# Patient Record
Sex: Female | Born: 1985
Health system: Southern US, Community
[De-identification: ages and names within clinical notes are randomized; demographics above are authoritative.]

## PROBLEM LIST (undated history)

## (undated) DIAGNOSIS — M5126 Other intervertebral disc displacement, lumbar region: Secondary | ICD-10-CM

## (undated) HISTORY — DX: Other intervertebral disc displacement, lumbar region: M51.26

## (undated) HISTORY — PX: OTHER SURGICAL HISTORY: SHX169

---

## 2011-06-27 ENCOUNTER — Encounter: Payer: Self-pay | Admitting: Family Medicine

## 2011-06-27 ENCOUNTER — Ambulatory Visit (INDEPENDENT_AMBULATORY_CARE_PROVIDER_SITE_OTHER): Payer: 59 | Admitting: Family Medicine

## 2011-06-27 DIAGNOSIS — L600 Ingrowing nail: Secondary | ICD-10-CM | POA: Insufficient documentation

## 2011-06-27 DIAGNOSIS — S93409A Sprain of unspecified ligament of unspecified ankle, initial encounter: Secondary | ICD-10-CM

## 2011-06-27 DIAGNOSIS — Z309 Encounter for contraceptive management, unspecified: Secondary | ICD-10-CM

## 2011-06-27 DIAGNOSIS — L709 Acne, unspecified: Secondary | ICD-10-CM | POA: Insufficient documentation

## 2011-06-27 DIAGNOSIS — IMO0001 Reserved for inherently not codable concepts without codable children: Secondary | ICD-10-CM | POA: Insufficient documentation

## 2011-06-27 DIAGNOSIS — L708 Other acne: Secondary | ICD-10-CM

## 2011-06-27 DIAGNOSIS — S93402A Sprain of unspecified ligament of left ankle, initial encounter: Secondary | ICD-10-CM

## 2011-06-27 MED ORDER — NORGESTIMATE-ETH ESTRADIOL 0.25-35 MG-MCG PO TABS: 1.0000 | ORAL_TABLET | Freq: Every day | ORAL | Status: DC

## 2011-06-27 MED ORDER — BENZOYL PEROXIDE 5 % EX GEL: Freq: Every day | CUTANEOUS | Status: DC

## 2011-06-27 MED ORDER — CLINDAMYCIN PHOSPHATE 1 % EX GEL: Freq: Two times a day (BID) | CUTANEOUS | Status: DC

## 2011-06-27 MED ORDER — SULFACETAMIDE SODIUM 10 % EX LOTN: TOPICAL_LOTION | CUTANEOUS | Status: DC

## 2011-06-27 NOTE — Patient Instructions (Signed)
Schedule your complete physical at your convenience Start the Clinda and Benzoyl peroxide combo BID Start the Sprintec when you complete your current pill pack Think of [place] as your home base! Call with any questions or concerns Welcome!  We're glad to have you!

## 2011-06-27 NOTE — Progress Notes (Signed)
  Subjective:    Patient ID: Teresa Graves, female    DOB: [birth year], 26 y.o.   MRN: [id number]  HPI New to establish.  Previous MD- Dr Providence Lanius, [facility].  Last CPE July 2011.  Acne- chronic problem, using sulfacetamide lotion BID.  Also on OCPs.  Has tried Proactiv w/out results.  Admits to poor compliance w/ daily abx use.    Birth control- considering switching due to vaginal dryness and poor acne control.  Periods are regular, no breakthrough bleeding.  Cramping is well controlled.  Previously on ortho-tricyclen and 2nd medication (can't remember the name).  L ankle sprain- occurred in September after inversion injury.  Pain and bruising has subsided, will still have intermittent swelling.  Able to run w/ minimal discomfort.  Ingrown toenails- would like to see podiatry  Review of Systems For ROS see HPI     Objective:   Physical Exam  Constitutional: She is oriented to person, place, and time. She appears well-developed and well-nourished. No distress.  HENT:  Head: Normocephalic and atraumatic.  Eyes: Conjunctivae and EOM are normal. Pupils are equal, round, and reactive to light.  Neck: Normal range of motion. Neck supple. No thyromegaly present.  Cardiovascular: Normal rate, regular rhythm, normal heart sounds and intact distal pulses.   No murmur heard. Pulmonary/Chest: Effort normal and breath sounds normal. No respiratory distress.  Abdominal: Soft. She exhibits no distension. There is no tenderness.  Musculoskeletal: She exhibits no edema (no swelling of ankles today) and no tenderness (no TTP over L lateral malleolus).  Lymphadenopathy:    She has no cervical adenopathy.  Neurological: She is alert and oriented to person, place, and time.  Skin: Skin is warm and dry.       Ingrown toenails bilaterally  Psychiatric: She has a normal mood and affect. Her behavior is normal.          Assessment & Plan:

## 2011-07-03 ENCOUNTER — Encounter: Payer: Self-pay | Admitting: *Deleted

## 2011-07-05 ENCOUNTER — Encounter: Payer: Self-pay | Admitting: *Deleted

## 2011-07-09 NOTE — Assessment & Plan Note (Signed)
Pt unhappy w/ vaginal dryness and breakthrough bleeding.  Will switch meds.

## 2011-07-09 NOTE — Assessment & Plan Note (Signed)
Pt doesn't feel sxs are well controlled.  Will switch acne products and birth control for better results.  Pt in agreement.

## 2011-07-09 NOTE — Assessment & Plan Note (Signed)
Old injury.  Apparently well healed as pt is able to run w/out difficulty.  No further workup at this time.  Reassurance provided.

## 2011-07-09 NOTE — Assessment & Plan Note (Signed)
Will refer to podiatry.  Chronic problem for pt.

## 2011-09-17 ENCOUNTER — Ambulatory Visit (INDEPENDENT_AMBULATORY_CARE_PROVIDER_SITE_OTHER): Payer: 59 | Admitting: Family Medicine

## 2011-09-17 ENCOUNTER — Encounter: Payer: Self-pay | Admitting: Family Medicine

## 2011-09-17 VITALS — BP 135/85 | HR 53 | Temp 98.2°F | Ht 67.0 in | Wt 180.0 lb

## 2011-09-17 DIAGNOSIS — M25572 Pain in left ankle and joints of left foot: Secondary | ICD-10-CM

## 2011-09-17 DIAGNOSIS — M25579 Pain in unspecified ankle and joints of unspecified foot: Secondary | ICD-10-CM

## 2011-09-17 NOTE — Patient Instructions (Signed)
You strained your achilles tendon. Take tylenol and/or aleve as needed for pain Lowering/raise on a step exercises 3 x 10 once a day - two feet first then one (start with 3 x 6) - WAIT 7-10 days before starting these. Can add heel walks, toe walks forward and backward as well Ice bucket 10-15 minutes at end of day - can ice 3-4 times a day. Avoid uneven ground, hills as much as possible. Ok to run as long as not limping and pain is < 3 on a scale of 1-10. Heel lifts help unload the area during the day. Over the counter inserts may help with this and your shin splints. If not improving can consider physical therapy, nitro patches.

## 2011-09-17 NOTE — Progress Notes (Signed)
  Subjective:    Patient ID: Teresa Graves, female    DOB: 04/16/1986, 26 y.o.   MRN: [id number]  PCP: Dr. Beverely Low  HPI 26 yo F here for left ankle pain  Patient reports back in September 2012 she recalls rolling her ankle day before her wedding. Saw podiatry, was given a home exercise program with toe raises - ankle has felt weak since this. Has been running regularly (daily basis) past few months and stated this past Saturday morning posterior aspect of left ankle was very sore following her home exercises. + swelling. Worse in morning and beginning of runs - loosens up after this. Has been icing, elevating, occasionally taking ibuprofen. No prior left foot/ankle injuries other than that in 02/2011. On feet all day as a pharmacist.  History reviewed. No pertinent past medical history.  Current Outpatient Prescriptions on File Prior to Visit  Medication Sig Dispense Refill  . benzoyl peroxide 5 % gel Apply topically daily.  45 g  0  . clindamycin (CLINDAGEL) 1 % gel Apply topically 2 (two) times daily.  30 g  0  . norgestimate-ethinyl estradiol (SPRINTEC 28) 0.25-35 MG-MCG tablet Take 1 tablet by mouth daily.  1 Package  11    History reviewed. No pertinent past surgical history.  Allergies  Allergen Reactions  . Doxycycline     Major vomiting     History   Social History  . Marital Status: Married    Spouse Name: N/A    Number of Children: N/A  . Years of Education: N/A   Occupational History  . Not on file.   Social History Main Topics  . Smoking status: Never Smoker   . Smokeless tobacco: Not on file  . Alcohol Use: Yes     socially  . Drug Use: Not on file  . Sexually Active: Not on file   Other Topics Concern  . Not on file   Social History Narrative  . No narrative on file    Family History  Problem Relation Age of Onset  . Cancer Maternal Grandmother   . Hyperlipidemia Mother   . Hypertension Mother   . Sudden death Neg Hx   . Diabetes Neg  Hx   . Heart attack Neg Hx     BP 135/85  Pulse 53  Temp(Src) 98.2 F (36.8 C) (Oral)  Ht 5\' 7"  (1.702 m)  Wt 180 lb (81.647 kg)  BMI 28.19 kg/m2    Review of Systems     Objective:   Physical Exam Gen: NAD  L ankle: No gross deformity, swelling, ecchymoses FROM with pain on full dorsiflexion TTP 1-2 cm proximal to achilles insertion on calcaneus.  No pain elsewhere about foot or ankle. Negative ant drawer and talar tilt.   Negative syndesmotic compression. Negative calcaneal squeeze. Thompsons test negative. NV intact distally. Unable to comfortably do single calf raise on left.  MSK u/s:  Achilles tendon intact on left side - no partial tears, neovascularity, or thickening.    Assessment & Plan:  1. Left ankle pain - 2/2 achilles strain.  Advised relative rest for next week with icing, tylenol/motrin, heel lifts.  Start HEP after 7-10 days - demonstrated this today and handout provided.  Consider PT, nitro patches, further rest if not improving as expected.  See instructions for further.

## 2011-09-23 DIAGNOSIS — M25572 Pain in left ankle and joints of left foot: Secondary | ICD-10-CM | POA: Insufficient documentation

## 2011-09-23 NOTE — Assessment & Plan Note (Signed)
2/2 achilles strain.  Advised relative rest for next week with icing, tylenol/motrin, heel lifts.  Start HEP after 7-10 days - demonstrated this today and handout provided.  Consider PT, nitro patches, further rest if not improving as expected.  See instructions for further.

## 2011-12-18 ENCOUNTER — Ambulatory Visit (INDEPENDENT_AMBULATORY_CARE_PROVIDER_SITE_OTHER): Payer: 59 | Admitting: Family Medicine

## 2011-12-18 ENCOUNTER — Encounter: Payer: Self-pay | Admitting: Family Medicine

## 2011-12-18 ENCOUNTER — Other Ambulatory Visit (HOSPITAL_COMMUNITY)
Admission: RE | Admit: 2011-12-18 | Discharge: 2011-12-18 | Disposition: A | Discharge status: Home / Self Care | Payer: 59 | Source: Ambulatory Visit | Attending: Family Medicine | Admitting: Family Medicine

## 2011-12-18 ENCOUNTER — Other Ambulatory Visit: Payer: Self-pay | Admitting: *Deleted

## 2011-12-18 VITALS — BP 115/68 | HR 71 | Temp 98.4°F | Ht 68.25 in | Wt 172.2 lb

## 2011-12-18 DIAGNOSIS — Z0000 Encounter for general adult medical examination without abnormal findings: Secondary | ICD-10-CM | POA: Insufficient documentation

## 2011-12-18 DIAGNOSIS — Z01419 Encounter for gynecological examination (general) (routine) without abnormal findings: Secondary | ICD-10-CM | POA: Insufficient documentation

## 2011-12-18 DIAGNOSIS — Z124 Encounter for screening for malignant neoplasm of cervix: Secondary | ICD-10-CM

## 2011-12-18 LAB — TSH: TSH: 1.07 u[IU]/mL (ref 0.35–5.50)

## 2011-12-18 LAB — CBC WITH DIFFERENTIAL/PLATELET
Basophils Relative: 0.7 % (ref 0.0–3.0)
Eosinophils Relative: 1.3 % (ref 0.0–5.0)
HCT: 43.3 % (ref 36.0–46.0)
Hemoglobin: 14.1 g/dL (ref 12.0–15.0)
Lymphs Abs: 1.8 10*3/uL (ref 0.7–4.0)
MCV: 92.2 fl (ref 78.0–100.0)
Monocytes Relative: 5.3 % (ref 3.0–12.0)
Neutro Abs: 5 10*3/uL (ref 1.4–7.7)
RBC: 4.7 Mil/uL (ref 3.87–5.11)
WBC: 7.3 10*3/uL (ref 4.5–10.5)

## 2011-12-18 LAB — LIPID PANEL: HDL: 56.3 mg/dL (ref >39.00)

## 2011-12-18 LAB — BASIC METABOLIC PANEL
BUN: 13 mg/dL (ref 6–23)
CO2: 29 mEq/L (ref 19–32)
Calcium: 9.7 mg/dL (ref 8.4–10.5)
Glucose, Bld: 81 mg/dL (ref 70–99)
Sodium: 140 mEq/L (ref 135–145)

## 2011-12-18 LAB — HEPATIC FUNCTION PANEL
Albumin: 4.4 g/dL (ref 3.5–5.2)
Alkaline Phosphatase: 46 U/L (ref 39–117)
Total Protein: 7.5 g/dL (ref 6.0–8.3)

## 2011-12-18 MED ORDER — CLINDAMYCIN PHOSPHATE 1 % EX GEL: Freq: Two times a day (BID) | CUTANEOUS | Status: AC

## 2011-12-18 MED ORDER — BENZOYL PEROXIDE 5 % EX GEL: Freq: Every day | CUTANEOUS | Status: DC

## 2011-12-18 NOTE — Patient Instructions (Addendum)
Follow up in 1 year or as needed Keep up the good work!  You look great! We'll notify you of your lab results Call with any questions or concerns Have a great summer!!! 

## 2011-12-18 NOTE — Telephone Encounter (Signed)
[facility] pharmacy rep Leotis Shames called to advise the pt insurance will not cover the benozoyl gel but did not her insurance will  cover the combo of the benozyl and clindigel, wanted to clarify if pt was using both gels in two separate areas?

## 2011-12-18 NOTE — Progress Notes (Signed)
  Subjective:    Patient ID: Teresa Graves, female    DOB: 1985-07-25, 26 y.o.   MRN: [id number]  HPI CPE- no concerns today.   Review of Systems Patient reports no vision/ hearing changes, adenopathy,fever, weight change,  persistant/recurrent hoarseness , swallowing issues, chest pain, palpitations, edema, persistant/recurrent cough, hemoptysis, dyspnea (rest/exertional/paroxysmal nocturnal), gastrointestinal bleeding (melena, rectal bleeding), abdominal pain, significant heartburn, bowel changes, GU symptoms (dysuria, hematuria, incontinence), Gyn symptoms (abnormal  bleeding, pain),  syncope, focal weakness, memory loss, numbness & tingling, skin/hair/nail changes, abnormal bruising or bleeding, anxiety, or depression.     Objective:   Physical Exam  General Appearance:    Alert, cooperative, no distress, appears stated age  Head:    Normocephalic, without obvious abnormality, atraumatic  Eyes:    PERRL, conjunctiva/corneas clear, EOM's intact, fundi    benign, both eyes  Ears:    Normal TM's and external ear canals, both ears  Nose:   Nares normal, septum midline, mucosa normal, no drainage    or sinus tenderness  Throat:   Lips, mucosa, and tongue normal; teeth and gums normal  Neck:   Supple, symmetrical, trachea midline, no adenopathy;    Thyroid: no enlargement/tenderness/nodules  Back:     Symmetric, no curvature, ROM normal, no CVA tenderness  Lungs:     Clear to auscultation bilaterally, respirations unlabored  Chest Wall:    No tenderness or deformity   Heart:    Regular rate and rhythm, S1 and S2 normal, no murmur, rub   or gallop  Breast Exam:    No tenderness, masses, or nipple abnormality  Abdomen:     Soft, non-tender, bowel sounds active all four quadrants,    no masses, no organomegaly  Genitalia:    External genitalia normal, cervix normal in appearance, no CMT, uterus in normal size and position, adnexa w/out mass or tenderness, mucosa pink and moist, no  lesions or discharge present  Rectal:    Normal external appearance  Extremities:   Extremities normal, atraumatic, no cyanosis or edema  Pulses:   2+ and symmetric all extremities  Skin:   Skin color, texture, turgor normal, no rashes or lesions  Lymph nodes:   Cervical, supraclavicular, and axillary nodes normal  Neurologic:   CNII-XII intact, normal strength, sensation and reflexes    throughout          Assessment & Plan:

## 2011-12-18 NOTE — Telephone Encounter (Signed)
Can do combo med if this is better covered

## 2011-12-18 NOTE — Telephone Encounter (Signed)
Called pharmacy and advised Teresa Graves it is ok to give the pt the combo medication with the same instructions, Teresa Graves understood and will fill the medication for the pt

## 2011-12-20 ENCOUNTER — Encounter: Payer: Self-pay | Admitting: *Deleted

## 2011-12-22 LAB — VITAMIN D 1,25 DIHYDROXY: Vitamin D 1, 25 (OH)2 Total: 45 pg/mL (ref 18–72)

## 2011-12-22 NOTE — Assessment & Plan Note (Signed)
Pap collected. 

## 2011-12-22 NOTE — Assessment & Plan Note (Signed)
Pt's PE WNL.  Check labs.  Anticipatory guidance provided.  

## 2011-12-23 ENCOUNTER — Encounter: Payer: Self-pay | Admitting: *Deleted

## 2011-12-30 ENCOUNTER — Other Ambulatory Visit: Payer: Self-pay | Admitting: Family Medicine

## 2011-12-30 NOTE — Telephone Encounter (Signed)
Refills x 2, last ov 6.26.13, according to chart both were filled on that day with 6-refills remaining 1-clindamycin  2-benzoyl

## 2011-12-31 NOTE — Telephone Encounter (Signed)
Called lauren with [facility] to advise that on 12-18-11 I spoke with lauren to advise ok to give pt combo of these two medications per phone note stating that on 12-18-11 the pt insurance would not pay for the medication separate however would pay for the combo of the two -clindamycin  2-benzoyl pt with the same directions, lauren noted that the pt never picked up the first combo medication and to please disregaurd the request for the medication at this time, they will contact pt about picking up the first rx for the combination of the two

## 2012-05-29 ENCOUNTER — Telehealth: Payer: Self-pay | Admitting: Family Medicine

## 2012-05-29 MED ORDER — NORGESTIMATE-ETH ESTRADIOL 0.25-35 MG-MCG PO TABS: 1.0000 | ORAL_TABLET | Freq: Every day | ORAL | Status: DC

## 2012-05-29 NOTE — Telephone Encounter (Signed)
Mononessa 28 tablet. Take 1 tablet by mouth daily. Qty 28. Last fill 03-05-12

## 2012-05-29 NOTE — Telephone Encounter (Signed)
Left message to call office. Spoke to [employer] who advise that this med is a generic form of Pt current birth control med. Rx sent

## 2012-08-05 ENCOUNTER — Encounter: Payer: Self-pay | Admitting: Family Medicine

## 2012-08-05 ENCOUNTER — Ambulatory Visit (INDEPENDENT_AMBULATORY_CARE_PROVIDER_SITE_OTHER): Payer: 59 | Admitting: Family Medicine

## 2012-08-05 VITALS — BP 118/70 | HR 64 | Temp 98.0°F | Ht 68.0 in | Wt 167.0 lb

## 2012-08-05 DIAGNOSIS — M546 Pain in thoracic spine: Secondary | ICD-10-CM

## 2012-08-05 NOTE — Progress Notes (Signed)
  Subjective:    Patient ID: Teresa Graves, female    DOB: 31-May-1986, 27 y.o.   MRN: [id number]  HPI Back pain- sxs started on Sunday, 'it really freaked me out and i don't scare easily'.  Has increased her running recently and started Body Pump.  Pain was 'upper L scapular region'.  Couldn't bend or reach for things w/out difficulty.  Couldn't take a deep breath w/out pain.  Monday felt ok, ran 5 miles but then pain returned for Monday night and continued Tuesday.  Pain is '90% better' today.  Took 400mg  ibuprofen last night w/ some relief.   Review of Systems For ROS see HPI     Objective:   Physical Exam  Vitals reviewed. Constitutional: She appears well-developed and well-nourished. No distress.  Musculoskeletal: Normal range of motion.  Normal ROM of L shoulder, neck Mild TTP over L lat No bony tenderness or deformity  Neurological: Coordination normal.  Normal strength and sensation          Assessment & Plan:

## 2012-08-05 NOTE — Patient Instructions (Addendum)
This is a muscle spasm Start the Ibuprofen 2-3x/day- take w/ food to avoid upset stomch HEAT! Massage!! Call with any questions or concerns Enjoy the snow!

## 2012-08-09 DIAGNOSIS — M546 Pain in thoracic spine: Secondary | ICD-10-CM | POA: Insufficient documentation

## 2012-08-09 NOTE — Assessment & Plan Note (Signed)
New.  Pt's pain is muscular and likely due from recent overuse/increase in physical activity.  Start NSAIDs prn.  Heat.  Reviewed supportive care and red flags that should prompt return.  Pt expressed understanding and is in agreement w/ plan.

## 2012-12-01 ENCOUNTER — Other Ambulatory Visit: Payer: Self-pay | Admitting: Family Medicine

## 2012-12-02 NOTE — Telephone Encounter (Signed)
Rx sent to the pharmacy by e-script.//AB/CMA 

## 2013-01-20 ENCOUNTER — Ambulatory Visit (INDEPENDENT_AMBULATORY_CARE_PROVIDER_SITE_OTHER): Payer: 59 | Admitting: Family Medicine

## 2013-01-20 ENCOUNTER — Encounter: Payer: Self-pay | Admitting: Family Medicine

## 2013-01-20 VITALS — BP 100/60 | HR 72 | Temp 98.1°F | Ht 68.0 in | Wt 164.8 lb

## 2013-01-20 DIAGNOSIS — Z0000 Encounter for general adult medical examination without abnormal findings: Secondary | ICD-10-CM

## 2013-01-20 DIAGNOSIS — Z01419 Encounter for gynecological examination (general) (routine) without abnormal findings: Secondary | ICD-10-CM

## 2013-01-20 DIAGNOSIS — Z1331 Encounter for screening for depression: Secondary | ICD-10-CM

## 2013-01-20 DIAGNOSIS — Z23 Encounter for immunization: Secondary | ICD-10-CM

## 2013-01-20 LAB — LIPID PANEL
Cholesterol: 174 mg/dL (ref 0–200)
HDL: 62.7 mg/dL (ref >39.00)
LDL Cholesterol: 91 mg/dL (ref 0–99)
Total CHOL/HDL Ratio: 3
Triglycerides: 101 mg/dL (ref 0.0–149.0)
VLDL: 20.2 mg/dL (ref 0.0–40.0)

## 2013-01-20 LAB — CBC WITH DIFFERENTIAL/PLATELET
Basophils Relative: 0.4 % (ref 0.0–3.0)
Eosinophils Relative: 1.2 % (ref 0.0–5.0)
Lymphocytes Relative: 19.5 % (ref 12.0–46.0)
MCV: 90.9 fl (ref 78.0–100.0)
Monocytes Relative: 5.2 % (ref 3.0–12.0)
Neutrophils Relative %: 73.7 % (ref 43.0–77.0)
RBC: 4.33 Mil/uL (ref 3.87–5.11)
WBC: 6.4 10*3/uL (ref 4.5–10.5)

## 2013-01-20 LAB — HEPATIC FUNCTION PANEL
ALT: 15 U/L (ref 0–35)
AST: 21 U/L (ref 0–37)
Albumin: 4 g/dL (ref 3.5–5.2)

## 2013-01-20 LAB — BASIC METABOLIC PANEL
BUN: 14 mg/dL (ref 6–23)
CO2: 28 mEq/L (ref 19–32)
Chloride: 106 mEq/L (ref 96–112)
Glucose, Bld: 71 mg/dL (ref 70–99)
Potassium: 4.2 mEq/L (ref 3.5–5.1)

## 2013-01-20 LAB — TSH: TSH: 1.03 u[IU]/mL (ref 0.35–5.50)

## 2013-01-20 NOTE — Progress Notes (Signed)
  Subjective:    Patient ID: Teresa Graves, female    DOB: [birth year], 27 y.o.   MRN: [id number]  HPI CPE- no concerns today   Review of Systems Patient reports no vision/ hearing changes, adenopathy,fever, weight change,  persistant/recurrent hoarseness , swallowing issues, chest pain, palpitations, edema, persistant/recurrent cough, hemoptysis, dyspnea (rest/exertional/paroxysmal nocturnal), gastrointestinal bleeding (melena, rectal bleeding), abdominal pain, significant heartburn, bowel changes, GU symptoms (dysuria, hematuria, incontinence), Gyn symptoms (abnormal  bleeding, pain),  syncope, focal weakness, memory loss, numbness & tingling, skin/hair/nail changes, abnormal bruising or bleeding, anxiety, or depression.     Objective:   Physical Exam  General Appearance:    Alert, cooperative, no distress, appears stated age  Head:    Normocephalic, without obvious abnormality, atraumatic  Eyes:    PERRL, conjunctiva/corneas clear, EOM's intact, fundi    benign, both eyes  Ears:    Normal TM's and external ear canals, both ears  Nose:   Nares normal, septum midline, mucosa normal, no drainage    or sinus tenderness  Throat:   Lips, mucosa, and tongue normal; teeth and gums normal  Neck:   Supple, symmetrical, trachea midline, no adenopathy;    Thyroid: no enlargement/tenderness/nodules  Back:     Symmetric, no curvature, ROM normal, no CVA tenderness  Lungs:     Clear to auscultation bilaterally, respirations unlabored  Chest Wall:    No tenderness or deformity   Heart:    Regular rate and rhythm, S1 and S2 normal, no murmur, rub   or gallop  Breast Exam:    No tenderness, masses, or nipple abnormality  Abdomen:     Soft, non-tender, bowel sounds active all four quadrants,    no masses, no organomegaly  Genitalia:    deferred  Rectal:    Extremities:   Extremities normal, atraumatic, no cyanosis or edema  Pulses:   2+ and symmetric all extremities  Skin:   Skin color,  texture, turgor normal, no rashes or lesions  Lymph nodes:   Cervical, supraclavicular, and axillary nodes normal  Neurologic:   CNII-XII intact, normal strength, sensation and reflexes    throughout          Assessment & Plan:

## 2013-01-20 NOTE — Assessment & Plan Note (Signed)
Pt's PE WNL.  UTD on pap.  Check labs.  Anticipatory guidance provided.  

## 2013-01-20 NOTE — Patient Instructions (Addendum)
Follow up in 1 year or as needed We'll notify you of your lab results Keep up the good work!  You look great! Call with any questions or concerns Enjoy the rest of summer!!!

## 2013-01-24 LAB — VITAMIN D 1,25 DIHYDROXY: Vitamin D3 1, 25 (OH)2: 74 pg/mL

## 2013-04-27 ENCOUNTER — Other Ambulatory Visit: Payer: Self-pay | Admitting: Family Medicine

## 2013-04-27 NOTE — Telephone Encounter (Signed)
Med filled.  

## 2013-04-29 ENCOUNTER — Other Ambulatory Visit: Payer: Self-pay

## 2013-05-28 ENCOUNTER — Other Ambulatory Visit: Payer: Self-pay | Admitting: Family Medicine

## 2013-05-28 NOTE — Telephone Encounter (Signed)
Med filled.  

## 2013-10-05 ENCOUNTER — Encounter: Payer: Self-pay | Admitting: Family Medicine

## 2013-10-06 ENCOUNTER — Encounter: Payer: Self-pay | Admitting: Nurse Practitioner

## 2013-10-06 ENCOUNTER — Ambulatory Visit (INDEPENDENT_AMBULATORY_CARE_PROVIDER_SITE_OTHER): Payer: 59 | Admitting: Nurse Practitioner

## 2013-10-06 VITALS — BP 112/64 | HR 88 | Temp 98.0°F | Ht 68.0 in | Wt 165.8 lb

## 2013-10-06 DIAGNOSIS — R05 Cough: Secondary | ICD-10-CM

## 2013-10-06 DIAGNOSIS — R059 Cough, unspecified: Secondary | ICD-10-CM

## 2013-10-06 DIAGNOSIS — J019 Acute sinusitis, unspecified: Secondary | ICD-10-CM

## 2013-10-06 MED ORDER — HYDROCODONE-HOMATROPINE 5-1.5 MG/5ML PO SYRP: 5.0000 mL | ORAL_SOLUTION | Freq: Every evening | ORAL | Status: DC | PRN

## 2013-10-06 MED ORDER — AMOXICILLIN-POT CLAVULANATE 875-125 MG PO TABS: 1.0000 | ORAL_TABLET | Freq: Two times a day (BID) | ORAL | Status: DC

## 2013-10-06 NOTE — Progress Notes (Signed)
   Subjective:    Patient ID: Teresa Graves, female    DOB: 03-Jun-1986, 28 y.o.   MRN: [id number]  Sinusitis This is a new problem. The current episode started 1 to 4 weeks ago (2 weeks). The problem has been gradually worsening since onset. There has been no fever. The pain is mild. Associated symptoms include congestion, coughing, headaches, a hoarse voice and sinus pressure. Pertinent negatives include no chills, ear pain, shortness of breath, sore throat or swollen glands. Past treatments include oral decongestants (antihistamines, mucolytics). The treatment provided no relief.      Review of Systems  Constitutional: Positive for fatigue. Negative for fever, chills, activity change and appetite change.  HENT: Positive for congestion, hoarse voice, postnasal drip and sinus pressure. Negative for ear pain, sore throat and voice change.   Respiratory: Positive for cough. Negative for chest tightness and shortness of breath.   Musculoskeletal: Negative for back pain.  Neurological: Positive for headaches.       Objective:   Physical Exam  Vitals reviewed. Constitutional: She is oriented to person, place, and time. She appears well-developed and well-nourished. No distress.  HENT:  Head: Normocephalic and atraumatic.  Right Ear: External ear normal.  Left Ear: External ear normal.  Mouth/Throat: Oropharynx is clear and moist. No oropharyngeal exudate.  Clear nasal d/c, swollen nasal mucosa  Eyes: Conjunctivae are normal. Right eye exhibits no discharge. Left eye exhibits no discharge.  Neck: Normal range of motion. Neck supple. No thyromegaly present.  Cardiovascular: Normal rate, regular rhythm and normal heart sounds.   No murmur heard. Pulmonary/Chest: Effort normal and breath sounds normal. No respiratory distress. She has no wheezes. She has no rales.  Lymphadenopathy:    She has no cervical adenopathy.  Neurological: She is alert and oriented to person, place, and time.    Skin: Skin is warm and dry.  Psychiatric: She has a normal mood and affect. Her behavior is normal. Thought content normal.          Assessment & Plan:  1. Sinusitis, acute 2 wks - amoxicillin-clavulanate (AUGMENTIN) 875-125 MG per tablet; Take 1 tablet by mouth 2 (two) times daily.  Dispense: 10 tablet; Refill: 0 See pt instructions. 2. Cough - HYDROcodone-homatropine (HYCODAN) 5-1.5 MG/5ML syrup; Take 5 mLs by mouth at bedtime as needed for cough.  Dispense: 120 mL; Refill: 0

## 2013-10-06 NOTE — Patient Instructions (Signed)
Start antibiotic. Eat yogurt daily at lunch or afternoon to help prevent diarrhea that can be caused by antibiotic. Start daily sinus rinses (Neilmed Sinus rinse) for at least 5-7 days. You may use pseudoephedrine 30 mg twice daily for 4-6 days. Use cough syrup at night for sleep. Please call for re-evaluation if you are not improving.   Sinusitis Sinusitis is redness, soreness, and swelling (inflammation) of the paranasal sinuses. Paranasal sinuses are air pockets within the bones of your face (beneath the eyes, the middle of the forehead, or above the eyes). In healthy paranasal sinuses, mucus is able to drain out, and air is able to circulate through them by way of your nose. However, when your paranasal sinuses are inflamed, mucus and air can become trapped. This can allow bacteria and other germs to grow and cause infection. Sinusitis can develop quickly and last only a short time (acute) or continue over a long period (chronic). Sinusitis that lasts for more than 12 weeks is considered chronic.  CAUSES  Causes of sinusitis include:  Allergies.  Structural abnormalities, such as displacement of the cartilage that separates your nostrils (deviated septum), which can decrease the air flow through your nose and sinuses and affect sinus drainage.  Functional abnormalities, such as when the small hairs (cilia) that line your sinuses and help remove mucus do not work properly or are not present. SYMPTOMS  Symptoms of acute and chronic sinusitis are the same. The primary symptoms are pain and pressure around the affected sinuses. Other symptoms include:  Upper toothache.  Earache.  Headache.  Bad breath.  Decreased sense of smell and taste.  A cough, which worsens when you are lying flat.  Fatigue.  Fever.  Thick drainage from your nose, which often is green and may contain pus (purulent).  Swelling and warmth over the affected sinuses. DIAGNOSIS  Your caregiver will perform a  physical exam. During the exam, your caregiver may:  Look in your nose for signs of abnormal growths in your nostrils (nasal polyps).  Tap over the affected sinus to check for signs of infection.  View the inside of your sinuses (endoscopy) with a special imaging device with a light attached (endoscope), which is inserted into your sinuses. If your caregiver suspects that you have chronic sinusitis, one or more of the following tests may be recommended:  Allergy tests.  Nasal culture A sample of mucus is taken from your nose and sent to a lab and screened for bacteria.  Nasal cytology A sample of mucus is taken from your nose and examined by your caregiver to determine if your sinusitis is related to an allergy. TREATMENT  Most cases of acute sinusitis are related to a viral infection and will resolve on their own within 10 days. Sometimes medicines are prescribed to help relieve symptoms (pain medicine, decongestants, nasal steroid sprays, or saline sprays).  However, for sinusitis related to a bacterial infection, your caregiver will prescribe antibiotic medicines. These are medicines that will help kill the bacteria causing the infection.  Rarely, sinusitis is caused by a fungal infection. In theses cases, your caregiver will prescribe antifungal medicine. For some cases of chronic sinusitis, surgery is needed. Generally, these are cases in which sinusitis recurs more than 3 times per year, despite other treatments. HOME CARE INSTRUCTIONS   Drink plenty of water. Water helps thin the mucus so your sinuses can drain more easily.  Use a humidifier.  Inhale steam 3 to 4 times a day (for example, sit  in the bathroom with the shower running).  Apply a warm, moist washcloth to your face 3 to 4 times a day, or as directed by your caregiver.  Use saline nasal sprays to help moisten and clean your sinuses.  Take over-the-counter or prescription medicines for pain, discomfort, or fever only  as directed by your caregiver. SEEK IMMEDIATE MEDICAL CARE IF:  You have increasing pain or severe headaches.  You have nausea, vomiting, or drowsiness.  You have swelling around your face.  You have vision problems.  You have a stiff neck.  You have difficulty breathing. MAKE SURE YOU:   Understand these instructions.  Will watch your condition.  Will get help right away if you are not doing well or get worse. Document Released: 06/10/2005 Document Revised: 09/02/2011 Document Reviewed: 06/25/2011 [facility] Patient Information 2014 [employer], [place].

## 2013-10-06 NOTE — Progress Notes (Signed)
Pre visit review using our clinic review tool, if applicable. No additional management support is needed unless otherwise documented below in the visit note. 

## 2013-10-11 ENCOUNTER — Other Ambulatory Visit: Payer: Self-pay | Admitting: Nurse Practitioner

## 2013-10-11 DIAGNOSIS — R059 Cough, unspecified: Secondary | ICD-10-CM

## 2013-10-11 DIAGNOSIS — R05 Cough: Secondary | ICD-10-CM

## 2013-10-11 MED ORDER — BENZONATATE 100 MG PO CAPS: ORAL_CAPSULE | ORAL | Status: DC

## 2013-10-13 NOTE — Telephone Encounter (Signed)
Left message to return our call regarding recommendations of Teresa SarinLayne Weaver, FNP:  Rx for benzonate tablets had been sent to her pharmacy and she can take Pseudoephedrine 30mg  2-3 times daily for 4-5 days. If pt is already taking this, we can send in Rx for flonase to be taken as well.

## 2013-10-13 NOTE — Telephone Encounter (Signed)
  Pt has not read MyChart msg.

## 2013-12-15 ENCOUNTER — Other Ambulatory Visit: Payer: Self-pay | Admitting: Family Medicine

## 2013-12-15 NOTE — Telephone Encounter (Signed)
Med filled.  

## 2013-12-17 ENCOUNTER — Encounter: Payer: Self-pay | Admitting: Family Medicine

## 2013-12-17 ENCOUNTER — Ambulatory Visit (INDEPENDENT_AMBULATORY_CARE_PROVIDER_SITE_OTHER): Payer: 59 | Admitting: Family Medicine

## 2013-12-17 VITALS — BP 102/66 | HR 58 | Temp 98.2°F | Resp 16 | Wt 169.5 lb

## 2013-12-17 DIAGNOSIS — Z01419 Encounter for gynecological examination (general) (routine) without abnormal findings: Secondary | ICD-10-CM

## 2013-12-17 NOTE — Progress Notes (Signed)
Pre visit review using our clinic review tool, if applicable. No additional management support is needed unless otherwise documented below in the visit note. 

## 2013-12-17 NOTE — Assessment & Plan Note (Signed)
Pt's PE WNL.  Check labs.  UTD on pap.  Anticipatory guidance provided.

## 2013-12-17 NOTE — Progress Notes (Signed)
   Subjective:    Patient ID: Teresa Graves, female    DOB: 01/07/1986, 28 y.o.   MRN: [id number]  HPI CPE- no concerns today.  UTD on pap.   Review of Systems Patient reports no vision/ hearing changes, adenopathy,fever, weight change,  persistant/recurrent hoarseness , swallowing issues, chest pain, palpitations, edema, persistant/recurrent cough, hemoptysis, dyspnea (rest/exertional/paroxysmal nocturnal), gastrointestinal bleeding (melena, rectal bleeding), abdominal pain, significant heartburn, bowel changes, GU symptoms (dysuria, hematuria, incontinence), Gyn symptoms (abnormal  bleeding, pain),  syncope, focal weakness, memory loss, numbness & tingling, skin/hair/nail changes, abnormal bruising or bleeding, anxiety, or depression.     Objective:   Physical Exam  General Appearance:    Alert, cooperative, no distress, appears stated age  Head:    Normocephalic, without obvious abnormality, atraumatic  Eyes:    PERRL, conjunctiva/corneas clear, EOM's intact, fundi    benign, both eyes  Ears:    Normal TM's and external ear canals, both ears  Nose:   Nares normal, septum midline, mucosa normal, no drainage    or sinus tenderness  Throat:   Lips, mucosa, and tongue normal; teeth and gums normal  Neck:   Supple, symmetrical, trachea midline, no adenopathy;    Thyroid: no enlargement/tenderness/nodules  Back:     Symmetric, no curvature, ROM normal, no CVA tenderness  Lungs:     Clear to auscultation bilaterally, respirations unlabored  Chest Wall:    No tenderness or deformity   Heart:    Regular rate and rhythm, S1 and S2 normal, no murmur, rub   or gallop  Breast Exam:    No tenderness, masses, or nipple abnormality  Abdomen:     Soft, non-tender, bowel sounds active all four quadrants,    no masses, no organomegaly  Genitalia:    deferred  Rectal:    Extremities:   Extremities normal, atraumatic, no cyanosis or edema  Pulses:   2+ and symmetric all extremities  Skin:    Skin color, texture, turgor normal, no rashes or lesions  Lymph nodes:   Cervical, supraclavicular, and axillary nodes normal  Neurologic:   CNII-XII intact, normal strength, sensation and reflexes    throughout          Assessment & Plan:

## 2013-12-17 NOTE — Patient Instructions (Signed)
Follow up in 1 year or as needed (cancel the other physical appt) We'll notify you of your lab results and make any changes if needed Call with any questions or concerns Keep up the good work!  You look great! Have a safe and wonderful trip to [place]!!!

## 2013-12-21 ENCOUNTER — Other Ambulatory Visit: Payer: 59

## 2013-12-22 ENCOUNTER — Other Ambulatory Visit: Payer: 59

## 2013-12-22 LAB — BASIC METABOLIC PANEL
BUN: 14 mg/dL (ref 6–23)
CHLORIDE: 105 meq/L (ref 96–112)
CO2: 27 meq/L (ref 19–32)
Calcium: 9.2 mg/dL (ref 8.4–10.5)
Creatinine, Ser: 0.8 mg/dL (ref 0.4–1.2)
GFR: 91.89 mL/min (ref >60.00)
GLUCOSE: 83 mg/dL (ref 70–99)
POTASSIUM: 4.1 meq/L (ref 3.5–5.1)
SODIUM: 139 meq/L (ref 135–145)

## 2013-12-22 LAB — HEPATIC FUNCTION PANEL
ALT: 18 U/L (ref 0–35)
AST: 19 U/L (ref 0–37)
Albumin: 3.9 g/dL (ref 3.5–5.2)
Alkaline Phosphatase: 38 U/L — ABNORMAL LOW (ref 39–117)
BILIRUBIN DIRECT: 0.1 mg/dL (ref 0.0–0.3)
BILIRUBIN TOTAL: 0.9 mg/dL (ref 0.2–1.2)
TOTAL PROTEIN: 6.8 g/dL (ref 6.0–8.3)

## 2013-12-22 LAB — CBC WITH DIFFERENTIAL/PLATELET
BASOS PCT: 0.5 % (ref 0.0–3.0)
Basophils Absolute: 0 10*3/uL (ref 0.0–0.1)
EOS PCT: 1.5 % (ref 0.0–5.0)
Eosinophils Absolute: 0.1 10*3/uL (ref 0.0–0.7)
HCT: 38.8 % (ref 36.0–46.0)
Hemoglobin: 13.1 g/dL (ref 12.0–15.0)
LYMPHS PCT: 33.8 % (ref 12.0–46.0)
Lymphs Abs: 2.1 10*3/uL (ref 0.7–4.0)
MCHC: 33.7 g/dL (ref 30.0–36.0)
MCV: 91.8 fl (ref 78.0–100.0)
Monocytes Absolute: 0.3 10*3/uL (ref 0.1–1.0)
Monocytes Relative: 5.5 % (ref 3.0–12.0)
NEUTROS ABS: 3.6 10*3/uL (ref 1.4–7.7)
NEUTROS PCT: 58.7 % (ref 43.0–77.0)
Platelets: 198 10*3/uL (ref 150.0–400.0)
RBC: 4.23 Mil/uL (ref 3.87–5.11)
RDW: 14.4 % (ref 11.5–15.5)
WBC: 6.2 10*3/uL (ref 4.0–10.5)

## 2013-12-22 LAB — LIPID PANEL
CHOLESTEROL: 184 mg/dL (ref 0–200)
HDL: 62.4 mg/dL (ref >39.00)
LDL CALC: 100 mg/dL — AB (ref 0–99)
NonHDL: 121.6
TRIGLYCERIDES: 110 mg/dL (ref 0.0–149.0)
Total CHOL/HDL Ratio: 3
VLDL: 22 mg/dL (ref 0.0–40.0)

## 2013-12-22 LAB — VITAMIN D 25 HYDROXY (VIT D DEFICIENCY, FRACTURES): VITD: 28.83 ng/mL

## 2013-12-22 LAB — TSH: TSH: 2.27 u[IU]/mL (ref 0.35–4.50)

## 2013-12-28 ENCOUNTER — Telehealth: Payer: Self-pay | Admitting: General Practice

## 2013-12-28 NOTE — Telephone Encounter (Signed)
Can you please call pt and have her schedule a fasting lab appt?

## 2013-12-28 NOTE — Telephone Encounter (Signed)
Message copied by Jackson LatinoYLER, Jaaliyah Lucatero L on Tue Dec 28, 2013  2:51 PM ------      Message from: Sheliah HatchABORI, KATHERINE E      Created: Wed Dec 22, 2013  8:04 AM       Please contact pt to schedule lab appt- she had her CPE on 6/26            ----- Message -----         From: SYSTEM         Sent: 12/22/2013  12:01 AM           To: Sheliah HatchKatherine E Tabori, MD                   ------

## 2014-03-31 ENCOUNTER — Ambulatory Visit (INDEPENDENT_AMBULATORY_CARE_PROVIDER_SITE_OTHER): Payer: 59 | Admitting: Medical

## 2014-03-31 ENCOUNTER — Encounter: Payer: Self-pay | Admitting: Medical

## 2014-03-31 VITALS — BP 118/76 | HR 56 | Temp 98.4°F | Ht 67.75 in | Wt 165.4 lb

## 2014-03-31 DIAGNOSIS — J3089 Other allergic rhinitis: Secondary | ICD-10-CM

## 2014-03-31 DIAGNOSIS — R059 Cough, unspecified: Secondary | ICD-10-CM | POA: Insufficient documentation

## 2014-03-31 DIAGNOSIS — J309 Allergic rhinitis, unspecified: Secondary | ICD-10-CM | POA: Insufficient documentation

## 2014-03-31 DIAGNOSIS — R05 Cough: Secondary | ICD-10-CM

## 2014-03-31 DIAGNOSIS — J029 Acute pharyngitis, unspecified: Secondary | ICD-10-CM

## 2014-03-31 DIAGNOSIS — J40 Bronchitis, not specified as acute or chronic: Secondary | ICD-10-CM

## 2014-03-31 MED ORDER — BENZONATATE 100 MG PO CAPS: 100.0000 mg | ORAL_CAPSULE | Freq: Three times a day (TID) | ORAL | Status: DC | PRN

## 2014-03-31 MED ORDER — FLUTICASONE PROPIONATE 50 MCG/ACT NA SUSP: 2.0000 | Freq: Every day | NASAL | Status: DC

## 2014-03-31 MED ORDER — AZITHROMYCIN 250 MG PO TABS: ORAL_TABLET | ORAL | Status: DC

## 2014-03-31 NOTE — Progress Notes (Signed)
Subjective:    Patient ID: Teresa Graves, female    DOB: 1985/07/04, 28 y.o.   MRN: [id number]  HPI  Pt states struggling to get over illness for 1 month. Pt states began st, runny nose, nasal congestion and productive cough.  No sneezing or itchy eyes. Pt tried sudafed, robitussin and ibuprofen.    Pt is not getting better. Pt moderate cough and keeping her up at night. Sorethroat moderate. Pt may have some pnd.  LMP- 03-06-2014.  No past medical history on file.  History   Social History  . Marital Status: Married    Spouse Name: N/A    Number of Children: N/A  . Years of Education: N/A   Occupational History  . Not on file.   Social History Main Topics  . Smoking status: Never Smoker   . Smokeless tobacco: Not on file  . Alcohol Use: Yes     Comment: socially  . Drug Use: Not on file  . Sexual Activity: Not on file   Other Topics Concern  . Not on file   Social History Narrative  . No narrative on file    No past surgical history on file.  Family History  Problem Relation Age of Onset  . Cancer Maternal Grandmother   . Hyperlipidemia Mother   . Hypertension Mother   . Sudden death Neg Hx   . Diabetes Neg Hx   . Heart attack Neg Hx     Allergies  Allergen Reactions  . Doxycycline     Major vomiting     Current Outpatient Prescriptions on File Prior to Visit  Medication Sig Dispense Refill  . clindamycin-benzoyl peroxide (BENZACLIN) gel APPLY TOPICALLY TWICE DAILY  50 g  6  . MONO-LINYAH 0.25-35 MG-MCG tablet TAKE 1 TABLET BY MOUTH DAILY.  28 tablet  6   No current facility-administered medications on file prior to visit.    BP 118/76  Pulse 56  Temp(Src) 98.4 F (36.9 C) (Oral)  Ht 5' 7.75" (1.721 m)  Wt 165 lb 6.4 oz (75.025 kg)  BMI 25.33 kg/m2  SpO2 97%  LMP 03/06/2014      Review of Systems  Constitutional: Negative for fever, chills and fatigue.  HENT: Positive for congestion, rhinorrhea and sore throat. Negative for ear  pain, sinus pressure, sneezing, tinnitus and voice change.        Mild st.  Respiratory: Positive for cough. Negative for chest tightness, shortness of breath and wheezing.   Cardiovascular: Negative for chest pain and palpitations.  Gastrointestinal: Negative.   Genitourinary: Negative.   Musculoskeletal: Negative for back pain.  Neurological: Negative.   Hematological: Negative for adenopathy. Does not bruise/bleed easily.  Psychiatric/Behavioral: Negative.        Objective:   Physical Exam  General  Mental Status - Alert. General Appearance - Well groomed. Not in acute distress. Sounds minimal nasal congested.  Skin Rashes- No Rashes.  HEENT Head- Normal. Ear Auditory Canal - Left- Normal. Right - Normal.Tympanic Membrane- Left- Normal. Right- Normal. Eye Sclera/Conjunctiva- Left- Normal. Right- Normal. Nose & Sinuses Nasal Mucosa- Left-  Boggy + Congested. Right-  Boggy + Congested. No sinus pressure. Mouth & Throat Lips: Upper Lip- Normal: no dryness, cracking, pallor, cyanosis, or vesicular eruption. Lower Lip-Normal: no dryness, cracking, pallor, cyanosis or vesicular eruption. Buccal Mucosa- Bilateral- No Aphthous ulcers. Oropharynx- No Discharge or Erythema. Tonsils: Characteristics- Bilateral- No Erythema or Congestion. Minimal pnd. Size/Enlargement- Bilateral- No enlargement. Discharge- bilateral-None.  Neck Neck- Supple. No  Masses. No lymphadenopathy.   Chest and Lung Exam Auscultation: Breath Sounds:-Normal, CTA.  Cardiovascular Auscultation:Rythm- Regular,  Rate and rhythm. Murmurs & Other Heart Sounds:Ausculatation of the heart reveal- No Murmurs.  Lymphatic Head & Neck General Head & Neck Lymphatics: Bilateral: Description- No Localized lymphadenopathy.        Assessment & Plan:

## 2014-03-31 NOTE — Assessment & Plan Note (Signed)
Rx benzonatate. 

## 2014-03-31 NOTE — Progress Notes (Signed)
Pre visit review using our clinic review tool, if applicable. No additional management support is needed unless otherwise documented below in the visit note. 

## 2014-03-31 NOTE — Assessment & Plan Note (Signed)
Rx of azithromycin and if symptoms persist by Monday or Tuesday then get cxr.

## 2014-03-31 NOTE — Assessment & Plan Note (Signed)
Rx of fluticasone. 

## 2014-03-31 NOTE — Patient Instructions (Addendum)
Your recent symptoms appear to have been probable allergic rhinitis early  and now  possible bronchitis.   I will rx benzonatate for your cough, fluticasone for your nasal congestion and azithromycin for the bronchitis. If your symptoms persist, worsen or change then I want you to get cxr on Monday or Tuesday. A future order for the chest xray will be placed.   Follow up in 7 days or as needed.

## 2014-03-31 NOTE — Assessment & Plan Note (Signed)
Azithromycin antibiotic. This is given for bronchitis and would be adequate in event of strep throat but I doubt this is present.

## 2014-06-08 ENCOUNTER — Other Ambulatory Visit: Payer: Self-pay | Admitting: Family Medicine

## 2014-06-09 NOTE — Telephone Encounter (Signed)
Med filled.  

## 2014-06-29 DIAGNOSIS — M47816 Spondylosis without myelopathy or radiculopathy, lumbar region: Secondary | ICD-10-CM | POA: Insufficient documentation

## 2014-09-28 ENCOUNTER — Ambulatory Visit: Payer: 59 | Attending: Chiropractor | Admitting: Physical Therapy

## 2014-09-28 ENCOUNTER — Encounter: Payer: Self-pay | Admitting: Physical Therapy

## 2014-09-28 DIAGNOSIS — M545 Low back pain, unspecified: Secondary | ICD-10-CM

## 2014-09-28 NOTE — Therapy (Signed)
[facility] [address] [place], [place], [address] Phone: [phone]   Fax:  [phone]  Physical Therapy Evaluation  Patient Details  Name: Teresa Graves MRN: [id number] Date of Birth: [birth year] Referring Provider:  Cooper, Jesse [place], [place]  Encounter Date: 09/28/2014      PT End of Session - 09/28/14 1316    Visit Number 1   Date for PT Re-Evaluation 11/09/14   PT Start Time 1230   PT Stop Time 1310   PT Time Calculation (min) 40 min   Activity Tolerance Patient tolerated treatment well   Behavior During Therapy [facility] for tasks assessed/performed      History reviewed. No pertinent past medical history.  History reviewed. No pertinent past surgical history.  There were no vitals filed for this visit.  Visit Diagnosis:  Midline low back pain without sciatica - Plan: PT plan of care cert/re-cert      Subjective Assessment - 09/28/14 1236    Subjective Patient reports lowvback pain started late October during a long drive to [place].  Patient reports she had increased soreness in her back.  Patient was seeing a chiropractor but not seeing long term benefits.    Limitations Other (comment)  laying flat, wake up with pain   How long can you sit comfortably? no difficulty   How long can you stand comfortably? No difficulty   How long can you walk comfortably? No difficulty   Patient Stated Goals decrease pain with laying down    Currently in Pain? Yes   Pain Score 5   wake up in morning   Pain Location Back   Pain Orientation Mid   Pain Descriptors / Indicators Sharp;Dull;Aching  stiff   Pain Type Chronic pain   Pain Onset More than a month ago   Pain Frequency Intermittent   Aggravating Factors  laying on back, waking up in the morning, falling asleep, wakes up in middle of night   Pain Relieving Factors movement   Effect of Pain on Daily Activities sleeping   Multiple Pain Sites No             [facility] PT Assessment - 09/28/14 0001    Assessment   Medical Diagnosis Facet syndrome, Lumbar   Onset Date 04/12/14   Precautions   Precautions None   Balance Screen   Has the patient fallen in the past 6 months No   Has the patient had a decrease in activity level because of a fear of falling?  No   Is the patient reluctant to leave their home because of a fear of falling?  No   Observation/Other Assessments   Focus on Therapeutic Outcomes (FOTO)  32% limitation   AROM   Lumbar Flexion full with deviate to right when coming back to neutral   Lumbar Extension decreased by 50% with pain   Lumbar - Right Side Bend decreased by 25%   Strength   Right Hip External Rotation  --  4/5   Right Hip Internal Rotation  --  4/5   Right Hip ABduction 3+/5   Palpation   Palpation right ilium posteriorly rotated, tenderness located on L3-L5, tender right posas, quadratus, right diaphragm   Pelvic Dictraction   Findings Positive   Side  Right   Comment pain                   [facility] Adult PT Treatment/Exercise - 09/28/14 0001    Manual Therapy  Manual Therapy Joint mobilization;Massage   Joint Mobilization sideglide to T12-L3 bil. right rib mobilization for downward movement   Massage right psoas, diaphragm, quadratus                PT Education - 09/28/14 1314    Education provided Yes   Education Details Quadratus stretch ,tennis ball trigger point massage   Person(s) Educated Patient   Methods Explanation;Demonstration;Tactile cues;Verbal cues;Handout   Comprehension Returned demonstration;Verbalized understanding          PT Short Term Goals - 09/28/14 1319    PT SHORT TERM GOAL #1   Title pain when waking up decreased >/= 25%   Time 3   Period Weeks   Status New   PT SHORT TERM GOAL #2   Title pain with laying flat on her back decreased >/= 25%           PT Long Term Goals - 09/28/14 1319    PT LONG TERM GOAL #1   Title pain with layin flat on  her back decreased >/= 75%   Time 6   Period Weeks   Status New   PT LONG TERM GOAL #2   Title pain with waking up in the morning decreased >/= 25%   PT LONG TERM GOAL #3   Title right hip strength and core improved so pelvis stays aligned   Time 6   Period Weeks   Status New   PT LONG TERM GOAL #4   Title independent with HEP   Time 6   Period Weeks   Status New               Plan - 09/28/14 1317    Clinical Impression Statement Patient has a tight right quadratus, psoas, and diaphragm.  Patient has decreased mobility of T11-L2. Patient has a weak right hip and core.    Pt will benefit from skilled therapeutic intervention in order to improve on the following deficits Impaired flexibility;Decreased endurance;Decreased activity tolerance;Increased fascial restricitons;Pain;Increased muscle spasms;Decreased mobility;Decreased strength   Rehab Potential Excellent   Clinical Impairments Affecting Rehab Potential None   PT Frequency 2x / week   PT Duration 6 weeks   PT Next Visit Plan Core strengthening, correct pelvis, right hip strengthening   PT Home Exercise Plan core strengthening   Consulted and Agree with Plan of Care Patient         Problem List Patient Active Problem List   Diagnosis Date Noted  . Allergic rhinitis 03/31/2014  . Acute pharyngitis 03/31/2014  . Bronchitis 03/31/2014  . Cough 03/31/2014  . Back pain, thoracic 08/09/2012  . Screening for malignant neoplasm of the cervix 12/18/2011  . Routine gynecological examination 12/18/2011  . Left ankle pain 09/23/2011  . Acne 06/27/2011  . Birth control 06/27/2011  . Ingrown toenail 06/27/2011    GRAY,CHERYL,PT 09/28/2014, 1:23 PM  [facility] [address] [place], [place], [address] Phone: [phone]   Fax:  [phone]

## 2014-09-28 NOTE — Patient Instructions (Signed)
Quadratus Stretch   Sit cross-legged on floor and bend sideways, touching left elbow to floor.Hold 15 sec Repeat _3___ times per set. Do __1__ sets per session. Do ___1_ sessions per day. To increase stretch, raise other arm above head  http://orth.exer.us/291   Copyright  VHI. All rights reserved.  Physical therapist instructed patient on how to use a tennis ball for trigger point massage to right quadratus.

## 2014-10-06 ENCOUNTER — Ambulatory Visit: Payer: 59 | Admitting: Physical Therapy

## 2014-10-06 ENCOUNTER — Encounter: Payer: Self-pay | Admitting: Physical Therapy

## 2014-10-06 DIAGNOSIS — M545 Low back pain, unspecified: Secondary | ICD-10-CM

## 2014-10-06 NOTE — Therapy (Signed)
[facility] [address] [place], [place], [address] Phone: [phone]   Fax:  [phone]  Physical Therapy Treatment  Patient Details  Name: Teresa Graves MRN: [id number] Date of Birth: 1986-02-15 Referring Provider:  Cooper, Jesse [place], Sebastian  Encounter Date: 10/06/2014      PT End of Session - 10/06/14 1645    Visit Number 2   Date for PT Re-Evaluation 11/09/14   PT Start Time 1400   PT Stop Time 1445   PT Time Calculation (min) 45 min   Activity Tolerance Patient tolerated treatment well   Behavior During Therapy [facility] for tasks assessed/performed      History reviewed. No pertinent past medical history.  History reviewed. No pertinent past surgical history.  There were no vitals filed for this visit.  Visit Diagnosis:  Midline low back pain without sciatica      Subjective Assessment - 10/06/14 1406    Subjective I felt good after therapy but did not last.  Patient feels it is better than usual. Waking up in the morning, laying flat is 50% better. Patient did not wake up in the middle of night.    Limitations Other (comment)  laying flat with legs straight   How long can you sit comfortably? no difficulty   How long can you stand comfortably? No difficulty   How long can you walk comfortably? No difficulty   Patient Stated Goals decrease pain with laying down    Currently in Pain? Yes   Pain Score 1    Pain Location Back   Pain Orientation Lower   Pain Descriptors / Indicators Sharp;Aching;Dull   Pain Type Chronic pain   Pain Onset More than a month ago   Pain Frequency Intermittent   Aggravating Factors  laying on back, waking up in the morning, falling asleep.   Pain Relieving Factors Movement   Effect of Pain on Daily Activities sleeping   Multiple Pain Sites No                       [facility] Adult PT Treatment/Exercise - 10/06/14 0001    Modalities   Modalities Ultrasound    Ultrasound   Ultrasound Location lumbar   Ultrasound Parameters 100%, 39mz, 1.2 w/cm2, 8 min.   Ultrasound Goals Pain   Manual Therapy   Manual Therapy Joint mobilization;Massage   Joint Mobilization extension of sacrum   Massage bil. lumbar thoracic paraspinals                PT Education - 10/06/14 1645    Education provided Yes   Education Details prone lift one leg with abdominal bracing   Person(s) Educated Patient   Methods Explanation;Demonstration;Tactile cues;Verbal cues;Handout   Comprehension Verbalized understanding;Returned demonstration          PT Short Term Goals - 10/06/14 1655    PT SHORT TERM GOAL #1   Title pain when waking up decreased >/= 25%   Time 3   Period Weeks   Status New  Not met due to just starting therapy   PT SHORT TERM GOAL #2   Title pain with laying flat on her back decreased >/= 25%   Time 3   Status New  not met due to just starting therapy           PT Long Term Goals - 09/28/14 1319    PT LONG TERM GOAL #1   Title pain with  layin flat on her back decreased >/= 75%   Time 6   Period Weeks   Status New   PT LONG TERM GOAL #2   Title pain with waking up in the morning decreased >/= 25%   PT LONG TERM GOAL #3   Title right hip strength and core improved so pelvis stays aligned   Time 6   Period Weeks   Status New   PT LONG TERM GOAL #4   Title independent with HEP   Time 6   Period Weeks   Status New               Plan - 10/06/14 1646    Clinical Impression Statement Patient has difficulty with stabilizing her spine with prone lift one leg.  Patient pelvis in correct alignment.  Patient has tenderness located in lumbar sacral area.  Patient sacrum is extension.    Pt will benefit from skilled therapeutic intervention in order to improve on the following deficits Impaired flexibility;Decreased endurance;Decreased activity tolerance;Increased fascial restricitons;Pain;Increased muscle spasms;Decreased  mobility;Decreased strength   Rehab Potential Excellent   Clinical Impairments Affecting Rehab Potential None   PT Frequency 2x / week   PT Duration 6 weeks   PT Next Visit Plan core strengthening, ultrasound to lumbar sacral area, soft tissue work.    PT Home Exercise Plan core strengthening   Consulted and Agree with Plan of Care Patient     PT reviewed trigger massage with a ball.    Problem List Patient Active Problem List   Diagnosis Date Noted  . Allergic rhinitis 03/31/2014  . Acute pharyngitis 03/31/2014  . Bronchitis 03/31/2014  . Cough 03/31/2014  . Back pain, thoracic 08/09/2012  . Screening for malignant neoplasm of the cervix 12/18/2011  . Routine gynecological examination 12/18/2011  . Left ankle pain 09/23/2011  . Acne 06/27/2011  . Birth control 06/27/2011  . Ingrown toenail 06/27/2011    Amaury Kuzel,PT 10/06/2014, 5:04 PM  [facility] [address] [place], [place], [address] Phone: [phone]   Fax:  [phone]

## 2014-10-06 NOTE — Patient Instructions (Addendum)
Straight Leg Raise (Prone)   Abdomen and head supported, keep left knee locked and raise leg at hip. Avoid arching low back. Keep hands under pelvis to keep equal pressure. Repeat _15___ times per set. Do __1__ sets per session. Do _1___ sessions per day. Then do other leg. PELVIC STABILIZATION: Pelvic Tilt (Lying)   Exhaling, pull belly toward spine, tilting pelvis forward. Inhaling, release. Then do diagonals  Repeat _10__ times. Do ___ times per day.  Copyright  VHI. All rights reserved.   http://orth.exer.us/1112   Copyright  VHI. All rights reserved.  Patient able to return demonstration correctly with above exercise.

## 2014-10-10 ENCOUNTER — Encounter: Payer: Self-pay | Admitting: Physical Therapy

## 2014-10-10 ENCOUNTER — Ambulatory Visit: Payer: 59 | Admitting: Physical Therapy

## 2014-10-10 ENCOUNTER — Other Ambulatory Visit: Payer: Self-pay | Admitting: Family Medicine

## 2014-10-10 DIAGNOSIS — M545 Low back pain, unspecified: Secondary | ICD-10-CM

## 2014-10-10 NOTE — Patient Instructions (Signed)

## 2014-10-10 NOTE — Therapy (Signed)
[facility] [address], [address] [place], [place], [address] Phone: [phone]   Fax:  [phone]  Physical Therapy Treatment  Patient Details  Name: Teresa Graves MRN: [id number] Date of Birth: 20-Jan-1986 Referring Provider:  Sheliah Hatchabori, Katherine E, MD  Encounter Date: 10/10/2014      PT End of Session - 10/10/14 1620    Visit Number 4   Date for PT Re-Evaluation 11/09/14      History reviewed. No pertinent past medical history.  History reviewed. No pertinent past surgical history.  There were no vitals filed for this visit.  Visit Diagnosis:  Midline low back pain without sciatica      Subjective Assessment - 10/10/14 1536    Subjective Did well over weekend.    Currently in Pain? Yes   Pain Score 3    Pain Location Back   Pain Orientation Lower   Pain Descriptors / Indicators Aching;Dull   Pain Type Chronic pain   Aggravating Factors  laying on back,    Pain Relieving Factors movement   Multiple Pain Sites No                         [facility] Adult PT Treatment/Exercise - 10/10/14 0001    Posture/Postural Control   Posture Comments Practice finding neutral spine in sitting and quadruped.   Lumbar Exercises: Stretches   Quadruped Mid Back Stretch 2 reps;20 seconds   Lumbar Exercises: Supine   Other Supine Lumbar Exercises TA exercise series in supine and SL   Lumbar Exercises: Prone   Straight Leg Raise 10 reps;2 seconds   Straight Leg Raises Limitations Pt stabilizes better when she fires her TA first.   Lumbar Exercises: Quadruped   Opposite Arm/Leg Raise --  3x bil Tactile cues for level pelvis                PT Education - 10/10/14 1556    Education provided Yes   Education Details Core exercises for HEP   Person(s) Educated Patient   Methods Explanation;Demonstration;Tactile cues;Verbal cues;Handout   Comprehension Verbalized understanding;Returned demonstration           PT Short Term Goals - 10/10/14 1621    PT SHORT TERM GOAL #1   Title pain when waking up decreased >/= 25%   Time 3   Period Weeks   Status New   PT SHORT TERM GOAL #2   Title pain with laying flat on her back decreased >/= 25%   Time 3   Status On-going           PT Long Term Goals - 09/28/14 1319    PT LONG TERM GOAL #1   Title pain with layin flat on her back decreased >/= 75%   Time 6   Period Weeks   Status New   PT LONG TERM GOAL #2   Title pain with waking up in the morning decreased >/= 25%   PT LONG TERM GOAL #3   Title right hip strength and core improved so pelvis stays aligned   Time 6   Period Weeks   Status New   PT LONG TERM GOAL #4   Title independent with HEP   Time 6   Period Weeks   Status New               Plan - 10/10/14 1617    Clinical Impression Statement Patient has difficulty keeping neutral spine  during her exercises. She could stabilze better when performing hip extension when firing her TA first.   Pt will benefit from skilled therapeutic intervention in order to improve on the following deficits Impaired flexibility;Decreased endurance;Decreased activity tolerance;Increased fascial restricitons;Pain;Increased muscle spasms;Decreased mobility;Decreased strength   Clinical Impairments Affecting Rehab Potential None   PT Frequency 2x / week   PT Duration 6 weeks   PT Next Visit Plan Continue core strength, modalities if needed, check alignment, soft tissue work   [employer] and Agree with Plan of Care Patient        Problem List Patient Active Problem List   Diagnosis Date Noted  . Allergic rhinitis 03/31/2014  . Acute pharyngitis 03/31/2014  . Bronchitis 03/31/2014  . Cough 03/31/2014  . Back pain, thoracic 08/09/2012  . Screening for malignant neoplasm of the cervix 12/18/2011  . Routine gynecological examination 12/18/2011  . Left ankle pain 09/23/2011  . Acne 06/27/2011  . Birth control 06/27/2011  .  Ingrown toenail 06/27/2011    Lejend Dalby, PTA 10/10/2014, 4:22 PM  [facility] [address], [address] [place], [place], [address] Phone: [phone]   Fax:  [phone]

## 2014-10-11 NOTE — Telephone Encounter (Signed)
Last OV 12-17-13 Medication last filled 04-27-13 50g

## 2014-10-11 NOTE — Telephone Encounter (Signed)
Will refill but please remind pt to schedule CPE

## 2014-10-12 ENCOUNTER — Encounter: Payer: Self-pay | Admitting: Physical Therapy

## 2014-10-12 ENCOUNTER — Ambulatory Visit: Payer: 59 | Admitting: Physical Therapy

## 2014-10-12 DIAGNOSIS — M545 Low back pain, unspecified: Secondary | ICD-10-CM

## 2014-10-12 NOTE — Therapy (Signed)
[facility] [address], [address] [place], [place], [address] Phone: [phone]   Fax:  [phone]  Physical Therapy Treatment  Patient Details  Name: Teresa Graves MRN: [id number] Date of Birth: [birth year] Referring Provider:  Cooper, Jesse Italyhad, DC  Encounter Date: 10/12/2014      PT End of Session - 10/12/14 1436    Visit Number 4   Date for PT Re-Evaluation 11/09/14   PT Start Time 1400   PT Stop Time 1430   PT Time Calculation (min) 30 min   Equipment Utilized During Treatment --  McConnell tape   Activity Tolerance Patient tolerated treatment well   Behavior During Therapy [facility] for tasks assessed/performed      History reviewed. No pertinent past medical history.  History reviewed. No pertinent past surgical history.  There were no vitals filed for this visit.  Visit Diagnosis:  Midline low back pain without sciatica      Subjective Assessment - 10/12/14 1406    Subjective Sleeping was bad last night.  I am having trouble the small movements to isolate.    Limitations Other (comment)  sleeping, laying on her back   Patient Stated Goals decrease pain with laying down    Currently in Pain? Yes   Pain Score 4                          [facility] Adult PT Treatment/Exercise - 10/12/14 0001    Manual Therapy   Manual Therapy Joint mobilization;Other (comment)   Joint Mobilization Muscle energy technique to correct right posteriorly rotated ilium, Rotational and P-Amobization grade 3 to L3-L5   Other Manual Therapy McConnel tape to SI for stabilization                 PT Education - 10/12/14 1436    Education provided Yes   Education Details educated patient on where to ge a SI belt if the tape helps with pelvic stability   Person(s) Educated Patient   Methods Explanation   Comprehension Verbalized understanding          PT Short Term Goals - 10/10/14 1621    PT SHORT TERM  GOAL #1   Title pain when waking up decreased >/= 25%   Time 3   Period Weeks   Status New   PT SHORT TERM GOAL #2   Title pain with laying flat on her back decreased >/= 25%   Time 3   Status On-going           PT Long Term Goals - 09/28/14 1319    PT LONG TERM GOAL #1   Title pain with layin flat on her back decreased >/= 75%   Time 6   Period Weeks   Status New   PT LONG TERM GOAL #2   Title pain with waking up in the morning decreased >/= 25%   PT LONG TERM GOAL #3   Title right hip strength and core improved so pelvis stays aligned   Time 6   Period Weeks   Status New   PT LONG TERM GOAL #4   Title independent with HEP   Time 6   Period Weeks   Status New               Plan - 10/12/14 1437    Clinical Impression Statement Patient pelvis was not in correct algingment therefore taped her SI posteriorly to  assist in stability. Patient worst position is laying in supine.    Pt will benefit from skilled therapeutic intervention in order to improve on the following deficits Impaired flexibility;Decreased endurance;Decreased activity tolerance;Increased fascial restricitons;Pain;Increased muscle spasms;Decreased mobility;Decreased strength   Rehab Potential Excellent   Clinical Impairments Affecting Rehab Potential None   PT Frequency 2x / week   PT Duration 6 weeks   PT Treatment/Interventions Moist Heat;Therapeutic activities;Patient/family education;Therapeutic exercise;Ultrasound;Manual techniques;Cryotherapy;Neuromuscular re-education;Functional mobility training;Electrical Stimulation   PT Next Visit Plan see if McConnell tape helped; core stability; check SI alignment   PT Home Exercise Plan core strengthening   Consulted and Agree with Plan of Care Patient        Problem List Patient Active Problem List   Diagnosis Date Noted  . Allergic rhinitis 03/31/2014  . Acute pharyngitis 03/31/2014  . Bronchitis 03/31/2014  . Cough 03/31/2014  . Back  pain, thoracic 08/09/2012  . Screening for malignant neoplasm of the cervix 12/18/2011  . Routine gynecological examination 12/18/2011  . Left ankle pain 09/23/2011  . Acne 06/27/2011  . Birth control 06/27/2011  . Ingrown toenail 06/27/2011    Verner Mccrone,PT 10/12/2014, 2:39 PM  [facility] [address] [place], [place], [address] Phone: [phone]   Fax:  [phone]

## 2014-10-17 ENCOUNTER — Ambulatory Visit: Payer: 59 | Admitting: Physical Therapy

## 2014-10-17 ENCOUNTER — Encounter: Payer: Self-pay | Admitting: Physical Therapy

## 2014-10-17 DIAGNOSIS — M545 Low back pain, unspecified: Secondary | ICD-10-CM

## 2014-10-17 NOTE — Patient Instructions (Signed)
Angry Cat, All Fours   Kneel on hands and knees. Tuck chin and tighten stomach. Exhale and round back upward. Inhale and arch back downward. Hold each position ___ seconds. Repeat ___ times per session. Do ___ sessions per day.  Copyright  VHI. All rights reserved.              Flexion   Sitting on knees, fold body over legs and relax head and arms on floor. Extend arms as far forward as possible. Hold ____ seconds. Repeat ____ times. Do ____ sessions per day.  Copyright  VHI. All rights reserved.  Combination (Quadruped)  On hands and knees with towel roll between knees, slowly inhale, and then exhale. Pull navel toward spine, squeeze roll with knees, and tighten pelvic floor. Hold for _5__ seconds. Rest for __2_ seconds. Repeat _5__ times. Do __2_ times a day.  Bracing With Arm Raise (Quadruped)  On hands and knees find neutral spine. Tighten pelvic floor and abdominals and hold. Alternately lift arm to shoulder level. Repeat _3-5__ times. Do 1-2___ times a day.  Quadruped Alternate Hip Extension   Shift weight to one side and raise opposite leg. Keep trunk steady. _3-5__ reps per set, _1-2__ sets per day, ___  Repeat with other leg.  Bracing With Arm / Leg Raise (Quadruped)  On hands and knees find neutral spine. Tighten pelvic floor and abdominals and hold. Alternating, lift arm to shoulder level and opposite leg to hip level. Repeat 3-5___ times. Do _1-2__ times a day.

## 2014-10-17 NOTE — Therapy (Signed)
[facility] [address], [address] [place], [place], [address] Phone: [phone]   Fax:  [phone]  Physical Therapy Treatment  Patient Details  Name: Teresa RastChristine E Telford MRN: [id number] Date of Birth: 21-Sep-1985 Referring Provider:  Sheliah Hatchabori, Katherine E, MD  Encounter Date: 10/17/2014      PT End of Session - 10/17/14 1309    Visit Number 5   Date for PT Re-Evaluation 11/09/14   PT Start Time 1230   PT Stop Time 1315   PT Time Calculation (min) 45 min   Activity Tolerance Patient tolerated treatment well   Behavior During Therapy [facility] for tasks assessed/performed      History reviewed. No pertinent past medical history.  History reviewed. No pertinent past surgical history.  There were no vitals filed for this visit.  Visit Diagnosis:  Midline low back pain without sciatica      Subjective Assessment - 10/17/14 1231    Subjective Good days, bad days. No particluar pattern.    Currently in Pain? Yes   Pain Score 2    Pain Location Back   Pain Orientation Lower   Aggravating Factors  Laying on back   Pain Relieving Factors Movement   Multiple Pain Sites No                         [facility] Adult PT Treatment/Exercise - 10/17/14 0001    Lumbar Exercises: Supine   Ab Set --  Review of TA series   Bent Knee Raise 20 reps   Other Supine Lumbar Exercises Ta with hip ER 10x bil   Lumbar Exercises: Quadruped   Single Arm Raise Right;Left;5 reps;3 seconds   Straight Leg Raise 5 reps;3 seconds   Opposite Arm/Leg Raise Right arm/Left leg;Left arm/Right leg;5 reps;3 seconds   Plank Forearms 2x 30 sec                PT Education - 10/17/14 1259    Education provided Yes   Education Details HEP advancement   Person(s) Educated Patient   Methods Explanation;Demonstration;Tactile cues;Verbal cues;Handout   Comprehension Verbalized understanding;Returned demonstration          PT Short Term  Goals - 10/17/14 1311    PT SHORT TERM GOAL #1   Title pain when waking up decreased >/= 25%   Time 3   Period Weeks   Status On-going  Not consistent   PT SHORT TERM GOAL #2   Title pain with laying flat on her back decreased >/= 25%   Time 3   Status On-going  Not consistent.           PT Long Term Goals - 09/28/14 1319    PT LONG TERM GOAL #1   Title pain with layin flat on her back decreased >/= 75%   Time 6   Period Weeks   Status New   PT LONG TERM GOAL #2   Title pain with waking up in the morning decreased >/= 25%   PT LONG TERM GOAL #3   Title right hip strength and core improved so pelvis stays aligned   Time 6   Period Weeks   Status New   PT LONG TERM GOAL #4   Title independent with HEP   Time 6   Period Weeks   Status New               Plan - 10/17/14 1310  Clinical Impression Statement Patient came into PT feeling pretty good. Advanced HEP to include quadruped exercises, core still very weak, pt working hard to identify TA muscle.   Pt will benefit from skilled therapeutic intervention in order to improve on the following deficits Impaired flexibility;Decreased endurance;Decreased activity tolerance;Increased fascial restricitons;Pain;Increased muscle spasms;Decreased mobility;Decreased strength   Rehab Potential Excellent   Clinical Impairments Affecting Rehab Potential None   PT Duration 6 weeks   PT Treatment/Interventions Moist Heat;Therapeutic activities;Patient/family education;Therapeutic exercise;Ultrasound;Manual techniques;Cryotherapy;Neuromuscular re-education;Functional mobility training;Electrical Stimulation   PT Next Visit Plan Check quadruped HEP, check SI alignment    Consulted and Agree with Plan of Care Patient        Problem List Patient Active Problem List   Diagnosis Date Noted  . Allergic rhinitis 03/31/2014  . Acute pharyngitis 03/31/2014  . Bronchitis 03/31/2014  . Cough 03/31/2014  . Back pain, thoracic  08/09/2012  . Screening for malignant neoplasm of the cervix 12/18/2011  . Routine gynecological examination 12/18/2011  . Left ankle pain 09/23/2011  . Acne 06/27/2011  . Birth control 06/27/2011  . Ingrown toenail 06/27/2011    Abdulloh Ullom, PTA 10/17/2014, 1:13 PM  [facility] [address], [address] [place], [place], [address] Phone: [phone]   Fax:  [phone]

## 2014-10-19 ENCOUNTER — Encounter: Payer: Self-pay | Admitting: Physical Therapy

## 2014-10-19 ENCOUNTER — Ambulatory Visit: Payer: 59 | Admitting: Physical Therapy

## 2014-10-19 DIAGNOSIS — M545 Low back pain, unspecified: Secondary | ICD-10-CM

## 2014-10-19 NOTE — Therapy (Signed)
[facility] [address], [address] [place], [place], [address] Phone: [phone]   Fax:  [phone]  Physical Therapy Treatment  Patient Details  Name: Teresa RastChristine E Graves MRN: [id number] Date of Birth: 06-08-1986 Referring Provider:  Sheliah Hatchabori, Katherine E, MD  Encounter Date: 10/19/2014      PT End of Session - 10/19/14 1520    Visit Number 6   Date for PT Re-Evaluation 11/09/14   PT Start Time 1445   PT Stop Time 1525   PT Time Calculation (min) 40 min   Activity Tolerance Patient tolerated treatment well   Behavior During Therapy [facility] for tasks assessed/performed      History reviewed. No pertinent past medical history.  History reviewed. No pertinent past surgical history.  There were no vitals filed for this visit.  Visit Diagnosis:  Midline low back pain without sciatica      Subjective Assessment - 10/19/14 1449    Subjective The brown tape did not help. I still have bad days when I cannot sleep.  Going to sleep is easier. I have less pain during the day.    Limitations Other (comment)   Patient Stated Goals decrease pain with laying down    Currently in Pain? Yes   Pain Score 7    Pain Location Back   Pain Orientation Lower   Pain Descriptors / Indicators Aching;Dull   Pain Type Chronic pain   Pain Onset More than a month ago   Pain Frequency Intermittent   Aggravating Factors  laying on her back   Pain Relieving Factors movement   Effect of Pain on Daily Activities sleeping   Multiple Pain Sites No            [facility] PT Assessment - 10/19/14 0001    Palpation   Palpation pelvis is balanced   Pelvic Dictraction   Findings Negative                     [facility] Adult PT Treatment/Exercise - 10/19/14 0001    Ultrasound   Ultrasound Location lumbar    Ultrasound Parameters 100%, 1 mhz, 1.2w/cm2, 8 min, prone   Ultrasound Goals Pain   Manual Therapy   Massage bil lumbar paraspinals,  quadratus, SI area in prone                PT Education - 10/19/14 1520    Education provided No          PT Short Term Goals - 10/17/14 1311    PT SHORT TERM GOAL #1   Title pain when waking up decreased >/= 25%   Time 3   Period Weeks   Status On-going  Not consistent   PT SHORT TERM GOAL #2   Title pain with laying flat on her back decreased >/= 25%   Time 3   Status On-going  Not consistent.           PT Long Term Goals - 09/28/14 1319    PT LONG TERM GOAL #1   Title pain with layin flat on her back decreased >/= 75%   Time 6   Period Weeks   Status New   PT LONG TERM GOAL #2   Title pain with waking up in the morning decreased >/= 25%   PT LONG TERM GOAL #3   Title right hip strength and core improved so pelvis stays aligned   Time 6   Period Weeks  Status New   PT LONG TERM GOAL #4   Title independent with HEP   Time 6   Period Weeks   Status New               Plan - 10/19/14 1520    Clinical Impression Statement Patient continues to have an equal pelvis without manual skills.  Patient has decreased stability of pelvis.  Patient was able to lay flat without pain after therapy.  Patient had no tenderness in right abdominals for first time.    Pt will benefit from skilled therapeutic intervention in order to improve on the following deficits Impaired flexibility;Decreased endurance;Decreased activity tolerance;Increased fascial restricitons;Pain;Increased muscle spasms;Decreased mobility;Decreased strength   Rehab Potential Excellent   Clinical Impairments Affecting Rehab Potential None   PT Frequency 2x / week   PT Duration 6 weeks   PT Treatment/Interventions Moist Heat;Therapeutic activities;Patient/family education;Therapeutic exercise;Ultrasound;Manual techniques;Cryotherapy;Neuromuscular re-education;Functional mobility training;Electrical Stimulation   PT Next Visit Plan hip strengthening, ultrsound and soft tissue work   PT Home  Exercise Plan current HEP   Consulted and Agree with Plan of Care Patient        Problem List Patient Active Problem List   Diagnosis Date Noted  . Allergic rhinitis 03/31/2014  . Acute pharyngitis 03/31/2014  . Bronchitis 03/31/2014  . Cough 03/31/2014  . Back pain, thoracic 08/09/2012  . Screening for malignant neoplasm of the cervix 12/18/2011  . Routine gynecological examination 12/18/2011  . Left ankle pain 09/23/2011  . Acne 06/27/2011  . Birth control 06/27/2011  . Ingrown toenail 06/27/2011    Oswin Griffith,PT 10/19/2014, 3:25 PM  [facility] [address] [place], [place], [address] Phone: [phone]   Fax:  [phone]

## 2014-10-26 ENCOUNTER — Ambulatory Visit: Payer: 59 | Attending: Chiropractor | Admitting: Physical Therapy

## 2014-10-26 DIAGNOSIS — M545 Low back pain: Secondary | ICD-10-CM | POA: Insufficient documentation

## 2014-10-27 ENCOUNTER — Ambulatory Visit: Payer: 59 | Admitting: Physical Therapy

## 2014-10-27 ENCOUNTER — Encounter: Payer: Self-pay | Admitting: Physical Therapy

## 2014-10-27 DIAGNOSIS — M545 Low back pain, unspecified: Secondary | ICD-10-CM

## 2014-10-27 NOTE — Patient Instructions (Addendum)
Strengthening: Hip Abduction (Side-Lying)   Tighten muscles on front of right  thigh, then lift right leg _6___ inches from surface, keeping knee locked.  Repeat _10___ times per set. Do __3__ sets per session. Do ___1_ sessions per day. Keep abdominal muscles tight.   http://orth.exer.us/622   Copyright  VHI. All rights reserved.  Lay on side left side with strap around upper thighs, Feet together, and knees bent.  Left top knee upward. 10 times 2 sets.     Abductor Strength: Bridge Pose (Strap)   Place red band around knees/ Press into band  with knees. Hold for _1___ breaths. Repeat __15__ times. Keep hips leveled  Copyright  VHI. All rights reserved.    Rib Cage Arms   Lie on back, legs bent, arms by sides. Inhale, lifting arms over head. Exhale, returning. Repeat _20___ times. Do __1and abdomen.  Copyright  VHI. All rights reserved.  Sitting with lower abdominals contracted, bring knee up and down where there is minimal popping. 10 times on both sides  Patient able to return demonstration with above exercises.

## 2014-10-27 NOTE — Therapy (Signed)
[facility] [address], [place], [place], [address] Phone: [phone]   Fax:  [phone]  Physical Therapy Treatment  Patient Details  Name: Teresa Graves MRN: [id number] Date of Birth: Nov 30, 1985 Referring Provider:  Midge Minium, MD  Encounter Date: 10/27/2014      PT End of Session - 10/27/14 1225    Visit Number 7   Date for PT Re-Evaluation 11/09/14   PT Start Time 6837   PT Stop Time 1230   PT Time Calculation (min) 45 min   Activity Tolerance Patient tolerated treatment well   Behavior During Therapy [facility] for tasks assessed/performed      History reviewed. No pertinent past medical history.  History reviewed. No pertinent past surgical history.  There were no vitals filed for this visit.  Visit Diagnosis:  Midline low back pain without sciatica      Subjective Assessment - 10/27/14 1152    Subjective I have noticed less days of pain. My run went well and no increased pain. Layin on back and sleeping is 30% better.    Patient Stated Goals decrease pain with laying down    Currently in Pain? Yes   Pain Score 2    Pain Location Back   Pain Orientation Lower   Pain Descriptors / Indicators Aching;Dull   Pain Type Chronic pain   Pain Onset More than a month ago   Pain Frequency Intermittent   Aggravating Factors  laying on back   Pain Relieving Factors movement   Multiple Pain Sites No            [facility] PT Assessment - 10/27/14 0001    Strength   Right Hip External Rotation  --  4/5   Right Hip Internal Rotation  --  5/5   Right Hip ABduction 4/5   Palpation   Palpation pelvis in correct alignment                             PT Education - 10/27/14 1224    Education provided Yes   Education Details clamshell,    Person(s) Educated Patient   Methods Explanation;Demonstration;Tactile cues;Verbal cues;Handout   Comprehension Returned  demonstration;Verbalized understanding;Verbal cues required          PT Short Term Goals - 10/27/14 1230    PT SHORT TERM GOAL #1   Title pain when waking up decreased >/= 25%   Time 3   Period Weeks   Status Achieved   PT SHORT TERM GOAL #2   Title pain with laying flat on her back decreased >/= 25%   Time 3   Status Achieved           PT Long Term Goals - 09/28/14 1319    PT LONG TERM GOAL #1   Title pain with layin flat on her back decreased >/= 75%   Time 6   Period Weeks   Status New   PT LONG TERM GOAL #2   Title pain with waking up in the morning decreased >/= 25%   PT LONG TERM GOAL #3   Title right hip strength and core improved so pelvis stays aligned   Time 6   Period Weeks   Status New   PT LONG TERM GOAL #4   Title independent with HEP   Time 6   Period Weeks   Status New  Plan - 10/27/14 1231    Clinical Impression Statement Patient has increased right hip abduction and ER improved to 4/5.  Patient can lay on her back and sleep with 30% less days with pain.  The intensity is the same. Patient has met all of her STG"s.  PT has to monitor clicking in lumbar spine with exercises due to instabilty.  Patient  has difficulty with  bridges  keeping her pelvis balanced.    Pt will benefit from skilled therapeutic intervention in order to improve on the following deficits Impaired flexibility;Decreased endurance;Decreased activity tolerance;Increased fascial restricitons;Pain;Increased muscle spasms;Decreased mobility;Decreased strength   Rehab Potential Excellent   Clinical Impairments Affecting Rehab Potential None   PT Frequency 2x / week   PT Duration 6 weeks   PT Treatment/Interventions Moist Heat;Therapeutic activities;Patient/family education;Therapeutic exercise;Ultrasound;Manual techniques;Cryotherapy;Neuromuscular re-education;Functional mobility training;Electrical Stimulation   PT Next Visit Plan core strengthening to work on  stability   PT Home Exercise Plan current HEP   Consulted and Agree with Plan of Care Patient        Problem List Patient Active Problem List   Diagnosis Date Noted  . Allergic rhinitis 03/31/2014  . Acute pharyngitis 03/31/2014  . Bronchitis 03/31/2014  . Cough 03/31/2014  . Back pain, thoracic 08/09/2012  . Screening for malignant neoplasm of the cervix 12/18/2011  . Routine gynecological examination 12/18/2011  . Left ankle pain 09/23/2011  . Acne 06/27/2011  . Birth control 06/27/2011  . Ingrown toenail 06/27/2011    GRAY,CHERYL,PT 10/27/2014, 12:34 PM  [facility] [address] [place], [place], [address] Phone: [phone]   Fax:  [phone]

## 2014-10-31 ENCOUNTER — Encounter: Payer: 59 | Admitting: Physical Therapy

## 2014-11-01 ENCOUNTER — Encounter: Payer: Self-pay | Admitting: Physical Therapy

## 2014-11-01 ENCOUNTER — Ambulatory Visit: Payer: 59 | Admitting: Physical Therapy

## 2014-11-01 DIAGNOSIS — M545 Low back pain, unspecified: Secondary | ICD-10-CM

## 2014-11-01 NOTE — Therapy (Signed)
[facility] [address], [address] [place], [place], [address] Phone: [phone]   Fax:  [phone]  Physical Therapy Treatment  Patient Details  Name: Teresa Graves MRN: [id number] Date of Birth: 10/05/1985 Referring Provider:  Sheliah Hatchabori, Katherine E, MD  Encounter Date: 11/01/2014      PT End of Session - 11/01/14 1649    Visit Number 8   Date for PT Re-Evaluation 11/09/14   PT Start Time 1610   PT Stop Time 1655   PT Time Calculation (min) 45 min   Activity Tolerance Patient tolerated treatment well   Behavior During Therapy [facility] for tasks assessed/performed      History reviewed. No pertinent past medical history.  History reviewed. No pertinent past surgical history.  There were no vitals filed for this visit.  Visit Diagnosis:  Midline low back pain without sciatica      Subjective Assessment - 11/01/14 1613    Subjective I feel the exercises are going okay.  I had pain after the side leg lifts. I have not been sleeping well due to if I sleep long I have increased pain. Pain with laying down with pain decreased by 50%.    Limitations Other (comment)   How long can you sit comfortably? no difficulty   How long can you stand comfortably? No difficulty   How long can you walk comfortably? No difficulty   Patient Stated Goals decrease pain with laying down    Currently in Pain? Yes   Pain Score 6    Pain Location Back   Pain Orientation Lower   Pain Descriptors / Indicators Aching;Dull   Pain Type Chronic pain   Pain Onset More than a month ago   Pain Frequency Intermittent   Aggravating Factors  laying on her back   Pain Relieving Factors Movement   Effect of Pain on Daily Activities sleeping   Multiple Pain Sites No            [facility] PT Assessment - 11/01/14 0001    AROM   Overall AROM  --  right sideglide decreased by 25% in lumbar   Lumbar Flexion full with shearing movement at level T10-11   Palpation   Palpation pelvis in correct alignment                     [facility] Adult PT Treatment/Exercise - 11/01/14 0001    Posture/Postural Control   Posture/Postural Control Postural limitations   Posture Comments worked on increased lumbar lordosis, do ont shift hips to the  right    Lumbar Exercises: Aerobic   Tread Mill running on treadmill to increase UE movement and thoracic movement   Lumbar Exercises: Standing   Other Standing Lumbar Exercises right sidebending without shifting her hip sto the right and no shearing   Lumbar Exercises: Supine   Bent Knee Raise 20 reps   Bridge 15 reps   Bridge Limitations able to keep hips from shifting   Straight Leg Raise 10 reps  abdominal contraction   Lumbar Exercises: Sidelying   Clam 15 reps   Clam Limitations on left with verbal cues to go slowly                PT Education - 11/01/14 1648    Education provided Yes   Education Details sidebend in standing preventing shifting, posture,    Person(s) Educated Patient   Methods Explanation;Demonstration   Comprehension Verbalized understanding;Returned demonstration  PT Short Term Goals - 10/27/14 1230    PT SHORT TERM GOAL #1   Title pain when waking up decreased >/= 25%   Time 3   Period Weeks   Status Achieved   PT SHORT TERM GOAL #2   Title pain with laying flat on her back decreased >/= 25%   Time 3   Status Achieved           PT Long Term Goals - 11/01/14 1653    PT LONG TERM GOAL #1   Title pain with layin flat on her back decreased >/= 75%   Time 6   Period Weeks   Status New  50%   PT LONG TERM GOAL #2   Title pain with waking up in the morning decreased >/= 25%   Time 6   Period Weeks   Status Achieved   PT LONG TERM GOAL #3   Title right hip strength and core improved so pelvis stays aligned   Time 6   Period Weeks   Status Achieved   PT LONG TERM GOAL #4   Title independent with HEP   Time 6   Period Weeks    Status New  still learning exercises               Plan - 11/01/14 1651    Clinical Impression Statement Patient is a 29 year old female with lumbar pain.  During trunk flexion she will shear at T10.  Patient has difficulty holding correct posture during stance phase.  Patient has decreased stability at level T10. Patient is now aware of her psotuer and understands how to correct it.     Pt will benefit from skilled therapeutic intervention in order to improve on the following deficits Impaired flexibility;Decreased endurance;Decreased activity tolerance;Increased fascial restricitons;Pain;Increased muscle spasms;Decreased mobility;Decreased strength   Rehab Potential Excellent   Clinical Impairments Affecting Rehab Potential None   PT Frequency 2x / week   PT Duration 6 weeks   PT Treatment/Interventions Moist Heat;Therapeutic activities;Patient/family education;Therapeutic exercise;Ultrasound;Manual techniques;Cryotherapy;Neuromuscular re-education;Functional mobility training;Electrical Stimulation   PT Next Visit Plan core strengthening to work on stability   PT Home Exercise Plan current HEP   Consulted and Agree with Plan of Care Patient        Problem List Patient Active Problem List   Diagnosis Date Noted  . Allergic rhinitis 03/31/2014  . Acute pharyngitis 03/31/2014  . Bronchitis 03/31/2014  . Cough 03/31/2014  . Back pain, thoracic 08/09/2012  . Screening for malignant neoplasm of the cervix 12/18/2011  . Routine gynecological examination 12/18/2011  . Left ankle pain 09/23/2011  . Acne 06/27/2011  . Birth control 06/27/2011  . Ingrown toenail 06/27/2011    Lulla Linville,PT 11/01/2014, 4:54 PM  [facility] [address], [address] [place], [place], [address] Phone: [phone]   Fax:  [phone]

## 2014-11-02 ENCOUNTER — Encounter: Payer: Self-pay | Admitting: Physical Therapy

## 2014-11-02 ENCOUNTER — Ambulatory Visit: Payer: 59 | Admitting: Physical Therapy

## 2014-11-02 DIAGNOSIS — M545 Low back pain, unspecified: Secondary | ICD-10-CM

## 2014-11-02 NOTE — Therapy (Signed)
[facility] [address], [address] [place], [place], [address] Phone: [phone]   Fax:  [phone]  Physical Therapy Treatment  Patient Details  Name: Teresa RastChristine E Graves MRN: [id number] Date of Birth: [birth year] Referring Provider:  Sheliah Hatchabori, Katherine E, MD  Encounter Date: 11/02/2014      PT End of Session - 11/02/14 1651    Visit Number 9   Date for PT Re-Evaluation 11/09/14   PT Start Time 1614   PT Stop Time 1655   PT Time Calculation (min) 41 min   Activity Tolerance Patient tolerated treatment well   Behavior During Therapy [facility] for tasks assessed/performed      History reviewed. No pertinent past medical history.  History reviewed. No pertinent past surgical history.  There were no vitals filed for this visit.  Visit Diagnosis:  Midline low back pain without sciatica      Subjective Assessment - 11/02/14 1615    Subjective I'm doing good.    Currently in Pain? No/denies   Multiple Pain Sites No                         [facility] Adult PT Treatment/Exercise - 11/02/14 0001    Lumbar Exercises: Standing   Other Standing Lumbar Exercises Horizontal abduction 10 x green band    Lumbar Exercises: Quadruped   Single Arm Raise Right;Left;5 reps;2 seconds   Straight Leg Raise 5 reps;3 seconds   Opposite Arm/Leg Raise Right arm/Left leg;Left arm/Right leg;5 reps;2 seconds   Plank Forearm 2 x 15 sec                PT Education - 11/02/14 1630    Education provided Yes   Education Details Sidelying hip series, green band horizontal abduction in standing   Methods Explanation;Demonstration;Tactile cues;Verbal cues;Handout   Comprehension Verbalized understanding;Returned demonstration          PT Short Term Goals - 11/02/14 1658    PT SHORT TERM GOAL #1   Title pain when waking up decreased >/= 25%   Time 3   Period Weeks   Status Achieved   PT SHORT TERM GOAL #2   Title pain with  laying flat on her back decreased >/= 25%   Time 3   Status Achieved           PT Long Term Goals - 11/01/14 1653    PT LONG TERM GOAL #1   Title pain with layin flat on her back decreased >/= 75%   Time 6   Period Weeks   Status New  50%   PT LONG TERM GOAL #2   Title pain with waking up in the morning decreased >/= 25%   Time 6   Period Weeks   Status Achieved   PT LONG TERM GOAL #3   Title right hip strength and core improved so pelvis stays aligned   Time 6   Period Weeks   Status Achieved   PT LONG TERM GOAL #4   Title independent with HEP   Time 6   Period Weeks   Status New  still learning exercises               Plan - 11/02/14 1654    Clinical Impression Statement Patient doing well with her HEP. Added to the HEP Pilates hip series today.    Pt will benefit from skilled therapeutic intervention in order to improve on the following deficits  Impaired flexibility;Decreased endurance;Decreased activity tolerance;Increased fascial restricitons;Pain;Increased muscle spasms;Decreased mobility;Decreased strength   Rehab Potential Excellent   Clinical Impairments Affecting Rehab Potential None   PT Frequency 2x / week   PT Duration 6 weeks   PT Treatment/Interventions Moist Heat;Therapeutic activities;Patient/family education;Therapeutic exercise;Ultrasound;Manual techniques;Cryotherapy;Neuromuscular re-education;Functional mobility training;Electrical Stimulation   PT Next Visit Plan Review hip series        Problem List Patient Active Problem List   Diagnosis Date Noted  . Allergic rhinitis 03/31/2014  . Acute pharyngitis 03/31/2014  . Bronchitis 03/31/2014  . Cough 03/31/2014  . Back pain, thoracic 08/09/2012  . Screening for malignant neoplasm of the cervix 12/18/2011  . Routine gynecological examination 12/18/2011  . Left ankle pain 09/23/2011  . Acne 06/27/2011  . Birth control 06/27/2011  . Ingrown toenail 06/27/2011    Addalynne Golding,  PTA 11/02/2014, 5:00 PM  [facility] [address], [address] [place], [place], [address] Phone: [phone]   Fax:  [phone]

## 2014-11-02 NOTE — Patient Instructions (Signed)
Sidelying hip series: You can [employer] "Pillates sidelying series."    Principles: Alignment: hips stacked and lower abs engaged prior to lifting leg. You may bend bottom leg for stability.  * Make sure your RT foot faces forward NOT upward, toes gently pointing.    1. Leg lifts 5-10x  Make sure to keep leg and torso long in this series. Neck soft. Inhale lift, exhale down.  2. Leg kicks forward & back. Keep torso organized by using your lower abs to stabilize. Inhale kick forward, exhale kick leg back without extending back.   3. Leg circles; 5-10 each direction starting with grapefruit size cirlces, keep torso organized.  Breathe normally.   4. Knee to chest- extend kneee straight: Inhale knee to chest exhale to straighten leg. You tighten the quads to straighten the knee AND the buttocks to extend the hip. Pretned like you are pressing 100# away from your body with your heel.  5-10 x.

## 2014-11-08 ENCOUNTER — Ambulatory Visit: Payer: 59 | Admitting: Physical Therapy

## 2014-11-08 ENCOUNTER — Encounter: Payer: Self-pay | Admitting: Physical Therapy

## 2014-11-08 DIAGNOSIS — M545 Low back pain, unspecified: Secondary | ICD-10-CM

## 2014-11-08 NOTE — Therapy (Signed)
[facility] [address], [address] [place], [place], [address] Phone: [phone]   Fax:  [phone]  Physical Therapy Treatment  Patient Details  Name: Teresa Graves MRN: [id number] Date of Birth: 1985/12/17 Referring Provider:  Sheliah Hatchabori, Katherine E, MD  Encounter Date: 11/08/2014      PT End of Session - 11/08/14 1706    Visit Number 10   Date for PT Re-Evaluation 12/21/14   PT Start Time 1530   PT Stop Time 1615   PT Time Calculation (min) 45 min   Activity Tolerance Patient tolerated treatment well   Behavior During Therapy [facility] for tasks assessed/performed      History reviewed. No pertinent past medical history.  History reviewed. No pertinent past surgical history.  There were no vitals filed for this visit.  Visit Diagnosis:  Midline low back pain without sciatica - Plan: PT plan of care cert/re-cert      Subjective Assessment - 11/08/14 1537    Subjective I had to drive alot over the weekend so I have more pain.  I am noticing that I need to stand upright. I am having more good days. Patient reports sleeping has improved by 50%. Patient reports laying on her back is 75% better.    Limitations Other (comment)   How long can you sit comfortably? no difficulty   How long can you stand comfortably? No difficulty   How long can you walk comfortably? No difficulty   Patient Stated Goals decrease pain with laying down    Currently in Pain? Yes   Pain Score 6    Pain Location Back   Pain Orientation Lower   Pain Descriptors / Indicators Aching;Dull   Pain Type Chronic pain   Pain Onset More than a month ago   Pain Frequency Intermittent   Aggravating Factors  laying on her back   Pain Relieving Factors movement   Effect of Pain on Daily Activities sleeping.    Multiple Pain Sites No            [facility] PT Assessment - 11/08/14 0001    Strength   Right Hip External Rotation  --  4+/5   Right Hip  Internal Rotation  --  5/5   Right Hip ABduction 4+/5  fatiques with increased reps   Palpation   Palpation pelvis in correct alignment                     [facility] Adult PT Treatment/Exercise - 11/08/14 0001    Lumbar Exercises: Supine   Bridge --  keeps pelvis in control   Lumbar Exercises: Sidelying   Other Sidelying Lumbar Exercises right hip abuction 20 x , hip sway 20x, hip cirlces 20  tactile cues to keep pelvis in alignment   Other Sidelying Lumbar Exercises right leg kick with extension with tactile cues to contract gluteals   Lumbar Exercises: Quadruped   Opposite Arm/Leg Raise Right arm/Left leg;Left arm/Right leg;5 reps;2 seconds   Opposite Arm/Leg Raise Limitations keep yoga mat along spine                PT Education - 11/08/14 1706    Education provided No          PT Short Term Goals - 11/02/14 1658    PT SHORT TERM GOAL #1   Title pain when waking up decreased >/= 25%   Time 3   Period Weeks   Status Achieved  PT SHORT TERM GOAL #2   Title pain with laying flat on her back decreased >/= 25%   Time 3   Status Achieved           PT Long Term Goals - 11/08/14 1609    PT LONG TERM GOAL #1   Title pain with layin flat on her back decreased >/= 75%   Time 6   Period Weeks   Status Achieved  75%   PT LONG TERM GOAL #3   Title right hip strength and core improved so pelvis stays aligned   Time 6   Period Weeks   Status Achieved   PT LONG TERM GOAL #4   Title independent with HEP   Time 6   Period Weeks   Status On-going  continuing with learning HEP   PT LONG TERM GOAL #5   Title sleep 6-8 hour sleep with pain decreased by 80%   Time 6   Period Weeks               Plan - 11/08/14 1709    Clinical Impression Statement Patient is a 29 year old female with lumbar pain. Patient has instability at T10-T11.  Patient has difficulty with her trunk stability with daily activities due to weakness and requires tactile cues  for correct alignment.  Patient has difficulty with sleeping after 6 hours due to lumbar pain.  Patient reports laying on her back with 75% less pain.  Patient reports her pain happens less often. Patient would benefit from physical therapy to focus on core stabilization and so she can lay in bed for more than 6 hours.    Pt will benefit from skilled therapeutic intervention in order to improve on the following deficits Impaired flexibility;Decreased endurance;Decreased activity tolerance;Increased fascial restricitons;Pain;Increased muscle spasms;Decreased mobility;Decreased strength   Rehab Potential Excellent   Clinical Impairments Affecting Rehab Potential None   PT Frequency 2x / week   PT Duration 6 weeks   PT Treatment/Interventions Moist Heat;Therapeutic activities;Patient/family education;Therapeutic exercise;Ultrasound;Manual techniques;Cryotherapy;Neuromuscular re-education;Functional mobility training;Electrical Stimulation   PT Next Visit Plan core stabilization    PT Home Exercise Plan current HEP   Consulted and Agree with Plan of Care Patient        Problem List Patient Active Problem List   Diagnosis Date Noted  . Allergic rhinitis 03/31/2014  . Acute pharyngitis 03/31/2014  . Bronchitis 03/31/2014  . Cough 03/31/2014  . Back pain, thoracic 08/09/2012  . Screening for malignant neoplasm of the cervix 12/18/2011  . Routine gynecological examination 12/18/2011  . Left ankle pain 09/23/2011  . Acne 06/27/2011  . Birth control 06/27/2011  . Ingrown toenail 06/27/2011    Oriel Ojo,PT 11/08/2014, 5:16 PM  [facility] [address], [address] [place], [place], [address] Phone: [phone]   Fax:  [phone]

## 2014-11-14 ENCOUNTER — Encounter: Payer: Self-pay | Admitting: Physical Therapy

## 2014-11-14 ENCOUNTER — Ambulatory Visit: Payer: 59 | Admitting: Physical Therapy

## 2014-11-14 DIAGNOSIS — M545 Low back pain, unspecified: Secondary | ICD-10-CM

## 2014-11-14 NOTE — Therapy (Signed)
[facility] [address] [place], [place], [address] Phone: [phone]   Fax:  [phone]  Physical Therapy Treatment  Patient Details  Name: Teresa Graves MRN: [id number] Date of Birth: 17-Jun-1986 Referring Provider:  Sheliah Hatch, MD  Encounter Date: 11/14/2014      PT End of Session - 11/14/14 1603    Visit Number 11   Date for PT Re-Evaluation 12/21/14   PT Start Time 1532   PT Stop Time 1620   PT Time Calculation (min) 48 min   Activity Tolerance Patient tolerated treatment well   Behavior During Therapy [facility] for tasks assessed/performed      No past medical history on file.  History reviewed. No pertinent past surgical history.  There were no vitals filed for this visit.  Visit Diagnosis:  Midline low back pain without sciatica      Subjective Assessment - 11/14/14 1534    Subjective Had a really painful weekend friday into Saturday. Pain was 6-7. Meds did not help over the weekend nor did ice.    Currently in Pain? Yes   Pain Score 3    Pain Location Back   Pain Orientation Lower   Pain Descriptors / Indicators Aching   Aggravating Factors  Not sure   Pain Relieving Factors Nothing lately   Multiple Pain Sites No                         [facility] Adult PT Treatment/Exercise - 11/14/14 0001    Lumbar Exercises: Stretches   Single Knee to Chest Stretch 3 reps;20 seconds  With knee pulling to opposite shoulder   Lumbar Exercises: Sidelying   Other Sidelying Lumbar Exercises right hip abuction 20 x , hip sway 20x, hip cirlces 20  tactile cues to keep pelvis in alignment   Electrical Stimulation   Electrical Stimulation Location Lumbar in prone   Electrical Stimulation Action IFC   Electrical Stimulation Parameters 80-150 HZ   Electrical Stimulation Goals Pain   Ultrasound   Ultrasound Location Lumbar in prone   Ultrasound Parameters 1.2wtcm/2 100%   Ultrasound  Goals Pain                PT Education - 11/14/14 1603    Education provided Yes   Education Details More of a postural reminder when at work   [employer]) Educated Patient   Methods Explanation;Demonstration   Comprehension Verbalized understanding;Returned demonstration          PT Short Term Goals - 11/14/14 1605    PT SHORT TERM GOAL #1   Title pain when waking up decreased >/= 25%   Time 3   Period Weeks   Status Achieved   PT SHORT TERM GOAL #2   Title pain with laying flat on her back decreased >/= 25%   Time 3   Status Achieved           PT Long Term Goals - 11/08/14 1609    PT LONG TERM GOAL #1   Title pain with layin flat on her back decreased >/= 75%   Time 6   Period Weeks   Status Achieved  75%   PT LONG TERM GOAL #3   Title right hip strength and core improved so pelvis stays aligned   Time 6   Period Weeks   Status Achieved   PT LONG TERM GOAL #4   Title independent with HEP  Time 6   Period Weeks   Status On-going  continuing with learning HEP   PT LONG TERM GOAL #5   Title sleep 6-8 hour sleep with pain decreased by 80%   Time 6   Period Weeks               Plan - 11/14/14 1604    Clinical Impression Statement Patient comes into PT today with pretty high complaints of pain in her lumbar spine, RT SLIGHTLY worse than LT. She reports doing nothing different and shows signs of frustration bc "I was feeling so good."   Pt will benefit from skilled therapeutic intervention in order to improve on the following deficits Impaired flexibility;Decreased endurance;Decreased activity tolerance;Increased fascial restricitons;Pain;Increased muscle spasms;Decreased mobility;Decreased strength   Rehab Potential Excellent   Clinical Impairments Affecting Rehab Potential None   PT Frequency 2x / week   PT Duration 6 weeks   PT Treatment/Interventions Moist Heat;Therapeutic activities;Patient/family education;Therapeutic  exercise;Ultrasound;Manual techniques;Cryotherapy;Neuromuscular re-education;Functional mobility training;Electrical Stimulation   PT Next Visit Plan core stabilization, assess pain   Consulted and Agree with Plan of Care Patient        Problem List Patient Active Problem List   Diagnosis Date Noted  . Allergic rhinitis 03/31/2014  . Acute pharyngitis 03/31/2014  . Bronchitis 03/31/2014  . Cough 03/31/2014  . Back pain, thoracic 08/09/2012  . Screening for malignant neoplasm of the cervix 12/18/2011  . Routine gynecological examination 12/18/2011  . Left ankle pain 09/23/2011  . Acne 06/27/2011  . Birth control 06/27/2011  . Ingrown toenail 06/27/2011    Caylie Sandquist 11/14/2014, 4:08 PM  [facility] [address] [place], [place], [address] Phone: [phone]   Fax:  [phone]

## 2014-11-23 ENCOUNTER — Ambulatory Visit: Payer: 59 | Attending: Chiropractor | Admitting: Physical Therapy

## 2014-11-23 ENCOUNTER — Encounter: Payer: Self-pay | Admitting: Physical Therapy

## 2014-11-23 DIAGNOSIS — M545 Low back pain, unspecified: Secondary | ICD-10-CM

## 2014-11-23 NOTE — Patient Instructions (Signed)
  PNF Strengthening: Resisted   Standing with resistive band around each hand, bring right arm up and away, thumb back. Repeat _10___ times per set. Do _2___ sets per session. Do _1-2___ sessions per day. Try on foam roll or laying on your back with knees bent.       Resisted Horizontal Abduction: Bilateral   Sit or stand, tubing in both hands, arms out in front. Keeping arms straight, pinch shoulder blades together and stretch arms out. Repeat _10___ times per set. Do 2____ sets per session. Do _1-2___ sessions per day. Do on foam roll or lay on your back with knees bent. You can try rolling up your yoga mat and laying on that.                  Scapular Retraction: Elbow Flexion (Standing)   With elbows bent to 90, pinch shoulder blades together and rotate arms out, keeping elbows bent. Repeat _1-5___ times per set. Do _1___ sets per session. Do many throughout your work day.

## 2014-11-23 NOTE — Therapy (Signed)
[facility] [address], [address] [place], [place], [address] Phone: [phone]   Fax:  [phone]  Physical Therapy Treatment  Patient Details  Name: Teresa Graves MRN: [id number] Date of Birth: 1985-09-16 Referring Provider:  Sheliah Hatchabori, Katherine E, MD  Encounter Date: 11/23/2014      PT End of Session - 11/23/14 1609    Visit Number 12   Date for PT Re-Evaluation 12/21/14   PT Start Time 1534   PT Stop Time 1625   PT Time Calculation (min) 51 min   Activity Tolerance Patient tolerated treatment well   Behavior During Therapy [facility] for tasks assessed/performed      No past medical history on file.  History reviewed. No pertinent past surgical history.  There were no vitals filed for this visit.  Visit Diagnosis:  Midline low back pain without sciatica      Subjective Assessment - 11/23/14 1540    Subjective Went camping/hiking this weekend and did great. Sleeping on hard ground was tough, but she did it.    Currently in Pain? No/denies   Multiple Pain Sites No                         [facility] Adult PT Treatment/Exercise - 11/23/14 0001    Lumbar Exercises: Supine   Other Supine Lumbar Exercises foam roll decompression x2 min,   Added red band scapular unattached esx to HEP.    Moist Heat Therapy   Number Minutes Moist Heat 15 Minutes   Moist Heat Location Lumbar Spine   Electrical Stimulation   Electrical Stimulation Location Lumbar in prone   Electrical Stimulation Action IFC   Electrical Stimulation Parameters 80-150HZ    Electrical Stimulation Goals Pain   Ultrasound   Ultrasound Location Lumbar   Ultrasound Parameters 1.2wtcm2 6 min 100%   Ultrasound Goals Pain                PT Education - 11/23/14 1545    Education provided Yes   Education Details Foam rol decpmpression and red band unattached exs   Person(s) Educated Patient   Methods Explanation;Demonstration;Tactile  cues;Verbal cues;Handout   Comprehension Verbalized understanding;Returned demonstration          PT Short Term Goals - 11/14/14 1605    PT SHORT TERM GOAL #1   Title pain when waking up decreased >/= 25%   Time 3   Period Weeks   Status Achieved   PT SHORT TERM GOAL #2   Title pain with laying flat on her back decreased >/= 25%   Time 3   Status Achieved           PT Long Term Goals - 11/23/14 1612    PT LONG TERM GOAL #4   Title independent with HEP   Time 6   Period Weeks   Status On-going  Added to this today.               Plan - 11/23/14 1610    Clinical Impression Statement PAtient has had little to no pain since last tx. She felt the ultrasound really helped. Added to HEP this week mid back extension exercises with band and foam roll decompresson.   Pt will benefit from skilled therapeutic intervention in order to improve on the following deficits Impaired flexibility;Decreased endurance;Decreased activity tolerance;Increased fascial restricitons;Pain;Increased muscle spasms;Decreased mobility;Decreased strength   Rehab Potential Excellent   Clinical Impairments Affecting Rehab Potential None  PT Frequency 2x / week   PT Duration 6 weeks   PT Treatment/Interventions Moist Heat;Therapeutic activities;Patient/family education;Therapeutic exercise;Ultrasound;Manual techniques;Cryotherapy;Neuromuscular re-education;Functional mobility training;Electrical Stimulation   PT Next Visit Plan Foam roll stabilization exs.   Consulted and Agree with Plan of Care Patient        Problem List Patient Active Problem List   Diagnosis Date Noted  . Allergic rhinitis 03/31/2014  . Acute pharyngitis 03/31/2014  . Bronchitis 03/31/2014  . Cough 03/31/2014  . Back pain, thoracic 08/09/2012  . Screening for malignant neoplasm of the cervix 12/18/2011  . Routine gynecological examination 12/18/2011  . Left ankle pain 09/23/2011  . Acne 06/27/2011  . Birth control  06/27/2011  . Ingrown toenail 06/27/2011    Shannel Zahm, PTA 11/23/2014, 4:13 PM  [facility] [address], [address] [place], [place], [address] Phone: [phone]   Fax:  [phone]

## 2014-11-30 ENCOUNTER — Ambulatory Visit: Payer: 59 | Admitting: Physical Therapy

## 2014-11-30 ENCOUNTER — Encounter: Payer: Self-pay | Admitting: Physical Therapy

## 2014-11-30 DIAGNOSIS — M545 Low back pain, unspecified: Secondary | ICD-10-CM

## 2014-11-30 NOTE — Therapy (Signed)
[facility] [address] [place], [place], [address] Phone: [phone]   Fax:  [phone]  Physical Therapy Treatment  Patient Details  Name: Teresa Graves MRN: [id number] Date of Birth: Nov 01, 1985 Referring Provider:  Midge Minium, MD  Encounter Date: 11/30/2014      PT End of Session - 11/30/14 1316    Visit Number 12   Date for PT Re-Evaluation 12/21/14   PT Start Time 1230   PT Stop Time 1310   PT Time Calculation (min) 40 min   Activity Tolerance Patient tolerated treatment well   Behavior During Therapy [facility] for tasks assessed/performed      History reviewed. No pertinent past medical history.  History reviewed. No pertinent past surgical history.  There were no vitals filed for this visit.  Visit Diagnosis:  Midline low back pain without sciatica      Subjective Assessment - 11/30/14 1237    Subjective I am having good days and bad days.    Limitations Other (comment)   How long can you sit comfortably? no difficulty   Patient Stated Goals decrease pain with laying down    Currently in Pain? Yes   Pain Score 3    Pain Location Back            [facility] PT Assessment - 11/30/14 0001    Assessment   Medical Diagnosis Facet syndrome, Lumbar   Onset Date/Surgical Date 04/12/14   AROM   Lumbar Extension decreased by 50% with pain   Strength   Right Hip External Rotation  --  4+/5   Right Hip ABduction 5/5                     [facility] Adult PT Treatment/Exercise - 11/30/14 0001    Manual Therapy   Manual Therapy Soft tissue mobilization   Soft tissue mobilization lumbar parapinals in prone                PT Education - 11/30/14 1313    Education provided Yes   Education Details verbally reviewed patients HEP and suggested to her to do 15 min. per day and alternate   Person(s) Educated Patient   Methods Explanation;Demonstration;Tactile cues;Verbal  cues;Handout   Comprehension Verbalized understanding;Returned demonstration          PT Short Term Goals - 11/14/14 1605    PT SHORT TERM GOAL #1   Title pain when waking up decreased >/= 25%   Time 3   Period Weeks   Status Achieved   PT SHORT TERM GOAL #2   Title pain with laying flat on her back decreased >/= 25%   Time 3   Status Achieved           PT Long Term Goals - 11/30/14 1318    PT LONG TERM GOAL #4   Title independent with HEP   Time 6   Period Weeks   Status On-going  pain is up and down   PT LONG TERM GOAL #5   Title sleep 6-8 hour sleep with pain decreased by 80%   Time 6   Period Weeks   Status On-going  trouble laying on her back               Plan - 11/30/14 1400    Clinical Impression Statement Patient is having continued pain in lumbar area. Patient reports she has good and bad days.  Patient consistently has pain  in lumbar when she lays on her back. Patient has not met any goals due to her pain is consistent and not feeling better in the past several weeks. Patient has full lumbar ROM with exception of extension decreased by 50% with pain. Patient has improved bilateral hip strength to 5/5. Patient is being placed on hold  due to her needing to have a consult on her back pain.  Patient has trigger points in left lumbar paraspinals.    Pt will benefit from skilled therapeutic intervention in order to improve on the following deficits Impaired flexibility;Decreased endurance;Decreased activity tolerance;Increased fascial restricitons;Pain;Increased muscle spasms;Decreased mobility;Decreased strength   Rehab Potential Excellent   Clinical Impairments Affecting Rehab Potential None   PT Frequency 2x / week   PT Duration 6 weeks   PT Next Visit Plan placed on hold till she has a second opinion   PT Home Exercise Plan current HEP   Consulted and Agree with Plan of Care Patient        Problem List Patient Active Problem List   Diagnosis  Date Noted  . Allergic rhinitis 03/31/2014  . Acute pharyngitis 03/31/2014  . Bronchitis 03/31/2014  . Cough 03/31/2014  . Back pain, thoracic 08/09/2012  . Screening for malignant neoplasm of the cervix 12/18/2011  . Routine gynecological examination 12/18/2011  . Left ankle pain 09/23/2011  . Acne 06/27/2011  . Birth control 06/27/2011  . Ingrown toenail 06/27/2011    Daniesha Driver,PT 11/30/2014, 2:05 PM  [facility] [address] [place], [place], [address] Phone: [phone]   Fax:  [phone]

## 2014-12-06 ENCOUNTER — Encounter: Payer: 59 | Admitting: Physical Therapy

## 2014-12-07 ENCOUNTER — Encounter: Payer: 59 | Admitting: Physical Therapy

## 2014-12-19 ENCOUNTER — Ambulatory Visit (INDEPENDENT_AMBULATORY_CARE_PROVIDER_SITE_OTHER): Payer: 59 | Admitting: Family Medicine

## 2014-12-19 ENCOUNTER — Encounter: Payer: Self-pay | Admitting: Family Medicine

## 2014-12-19 VITALS — BP 120/80 | HR 66 | Temp 98.0°F | Resp 16 | Ht 68.0 in | Wt 167.4 lb

## 2014-12-19 DIAGNOSIS — M545 Low back pain, unspecified: Secondary | ICD-10-CM | POA: Insufficient documentation

## 2014-12-19 MED ORDER — CYCLOBENZAPRINE HCL 10 MG PO TABS
10.0000 mg | ORAL_TABLET | Freq: Three times a day (TID) | ORAL | Status: DC | PRN
Start: 2014-12-19 — End: 2015-12-20

## 2014-12-19 MED ORDER — MELOXICAM 15 MG PO TABS: 15.0000 mg | ORAL_TABLET | Freq: Every day | ORAL | Status: DC

## 2014-12-19 NOTE — Patient Instructions (Signed)
Schedule your complete physical at your convenience Start the Mobic daily for 1-2 weeks and then as needed- take w/ food Use the flexeril as needed for spasm We'll call you with your ortho referral Call with any questions or concerns Hang in there!!!

## 2014-12-19 NOTE — Progress Notes (Signed)
Pre visit review using our clinic review tool, if applicable. No additional management support is needed unless otherwise documented below in the visit note. 

## 2014-12-19 NOTE — Progress Notes (Signed)
   Subjective:    Patient ID: Teresa Graves, female    DOB: [birth year], 29 y.o.   MRN: [id number]  HPI LBP- sxs started early November 2015.  Pain was severe the 1st week and then eased but did not resolve.  Started seeing chiropractic w/ symptom improvement immediately after visits but was unable to get any long lasting relief in order to space out appts.  Was then sent to PT for 6-8 weeks w/ some transient improvement but no lasting relief.  Unable to lie flat w/o it being 'very uncomfortable'.  Pain is central but radiates across entire low back.  No radiation of pain into butt or thighs, no radiation upwards.  No weakness or numbness of legs, no bowel or bladder incontinence.   Review of Systems For ROS see HPI     Objective:   Physical Exam  Constitutional: She is oriented to person, place, and time. She appears well-developed and well-nourished. No distress.  HENT:  Head: Normocephalic and atraumatic.  Cardiovascular: Intact distal pulses.   Musculoskeletal: She exhibits tenderness (TTP over lumbar spine and SI joints bilaterally, pain w/ extension > flexion).  Neurological: She is alert and oriented to person, place, and time. She has normal reflexes. No cranial nerve deficit.  (-) SLR bilaterally  Skin: Skin is warm and dry. No erythema.  Psychiatric: She has a normal mood and affect. Her behavior is normal. Thought content normal.  Vitals reviewed.         Assessment & Plan:

## 2014-12-26 NOTE — Assessment & Plan Note (Signed)
New to provider, ongoing for pt.  No bony tenderness over spine.  Pain seems to be more muscular and/or SI joints.  Start daily Mobic for inflammation and flexeril prn for spasm.  Refer to Dr Ethelene Halamos for complete evaluation and tx as this has been going on for months.  Reviewed supportive care and red flags that should prompt return.  Pt expressed understanding and is in agreement w/ plan.

## 2014-12-28 ENCOUNTER — Ambulatory Visit: Payer: 59 | Attending: Chiropractor | Admitting: Physical Therapy

## 2014-12-28 ENCOUNTER — Encounter: Payer: Self-pay | Admitting: Physical Therapy

## 2014-12-28 DIAGNOSIS — M545 Low back pain, unspecified: Secondary | ICD-10-CM

## 2014-12-28 NOTE — Therapy (Addendum)
[facility] [address] [place], [place], [address] Phone: [phone]   Fax:  [phone]  Physical Therapy Treatment  Patient Details  Name: Teresa Graves MRN: [id number] Date of Birth: 09/12/1985 Referring Provider:  Midge Minium, MD  Encounter Date: 12/28/2014      PT End of Session - 12/28/14 0820    Visit Number 13   Date for PT Re-Evaluation 02/01/15   PT Start Time 0800   PT Stop Time 0900   PT Time Calculation (min) 60 min   Activity Tolerance Patient tolerated treatment well   Behavior During Therapy [facility] for tasks assessed/performed      History reviewed. No pertinent past medical history.  History reviewed. No pertinent past surgical history.  There were no vitals filed for this visit.  Visit Diagnosis:  Midline low back pain without sciatica - Plan: PT plan of care cert/re-cert      Subjective Assessment - 12/28/14 0803    Subjective I was having a pain level 7/10 in low back area last week but it has calmed down to 3/10.  I have an appointment with Dr. Nelva Bush on 01/11/2015.    Limitations Sitting   How long can you sit comfortably? always a 3/10   How long can you stand comfortably? standing and moving decreases pain to 2/10.    How long can you walk comfortably? decreases pain to 2/10.    Patient Stated Goals decrease pain with laying down    Currently in Pain? Yes   Pain Score 3    Pain Location Back   Pain Orientation Lower   Pain Descriptors / Indicators Aching;Dull;Sharp   Pain Type Chronic pain   Pain Onset More than a month ago   Pain Frequency Constant   Aggravating Factors  sitting, laying flat on back, bending backwards   Pain Relieving Factors walking, standing   Effect of Pain on Daily Activities sleeping   Multiple Pain Sites No            [facility] PT Assessment - 12/28/14 0001    Assessment   Medical Diagnosis Facet syndrome, Lumbar   Onset Date/Surgical Date  04/12/14   Next MD Visit 01/11/2015   Precautions   Precautions None   Balance Screen   Has the patient fallen in the past 6 months No   Has the patient had a decrease in activity level because of a fear of falling?  No   Is the patient reluctant to leave their home because of a fear of falling?  No   Prior Function   Level of Independence Independent   AROM   Lumbar Flexion goes to the left full with most movement at hips   Lumbar Extension decreased by 75%   Lumbar - Right Side Bend full   Palpation   SI assessment  correct alignment                     [facility] Adult PT Treatment/Exercise - 12/28/14 0001    Moist Heat Therapy   Number Minutes Moist Heat 15 Minutes   Moist Heat Location Lumbar Spine   Electrical Stimulation   Electrical Stimulation Location Lumbar in prone   Electrical Stimulation Action IFC   Electrical Stimulation Parameters to patient tolerance;15 min   Electrical Stimulation Goals Pain   Ultrasound   Ultrasound Location bil. sides of T11-L4   Ultrasound Parameters 100%, 1 mhz, 1.2 w/cm2, 8 min,  Ultrasound Goals Pain   Manual Therapy   Manual Therapy Soft tissue mobilization   Soft tissue mobilization to bil. sides of thoracic lumbar paraspinals                PT Education - 12/28/14 0835    Education provided No          PT Short Term Goals - 11/14/14 1605    PT SHORT TERM GOAL #1   Title pain when waking up decreased >/= 25%   Time 3   Period Weeks   Status Achieved   PT SHORT TERM GOAL #2   Title pain with laying flat on her back decreased >/= 25%   Time 3   Status Achieved           PT Long Term Goals - 12/28/14 0817    PT LONG TERM GOAL #1   Title pain with layin flat on her back decreased >/= 75%   Time 6   Period Weeks   Status Not Met  recent flareup therefore not met   PT LONG TERM GOAL #2   Title pain with waking up in the morning decreased >/= 25%   Time 6   Period Weeks   Status Not Met  not  met due to recent flare-up   PT LONG TERM GOAL #3   Title right hip strength and core improved so pelvis stays aligned   Time 6   Period Weeks   Status Achieved   PT LONG TERM GOAL #4   Title independent with HEP   Time 6   Period Weeks   Status On-going               Plan - 12/28/14 0835    Clinical Impression Statement Patient is a 29 year old female with diagnosis of facet syndrome.  Patient had a recent flare up to her low back with sudden onset last week.  Last week her pain level was 7/10 and this week is 3/10. Pain is worse with sitting, laying on back, and first thing in the morning. Pain  decreases with standing and walking.   Patient has not been able to run consistently due to back pain.  Lumbar flexion is full with most movement at the hips and deviates to the left.  Lumbar extension decreased by 75% due to pain. bilateral sidebending is full but does a shift movement at L2-L3. Pelvis in correct alignment. Patient has not met her LTG goasl due to recent flare-up and the progress she made several weeks ago has become negated. Patient is having difficulty with sleeping due to pain and not being able to lay flat on her back. Patient would benefit from physical therapy for pain relief untile she sees another physician, Dr. Nelva Bush, on 01/11/2015 to furhter assess her back.    Pt will benefit from skilled therapeutic intervention in order to improve on the following deficits Impaired flexibility;Decreased endurance;Decreased activity tolerance;Increased fascial restricitons;Pain;Increased muscle spasms;Decreased mobility;Decreased strength   Rehab Potential Excellent   Clinical Impairments Affecting Rehab Potential None   PT Frequency 2x / week   PT Duration 6 weeks   PT Treatment/Interventions Moist Heat;Therapeutic activities;Patient/family education;Therapeutic exercise;Ultrasound;Manual techniques;Cryotherapy;Neuromuscular re-education;Functional mobility training;Electrical  Stimulation   PT Next Visit Plan pain relief with modalities, soft tissue wor, body mechanics   PT Home Exercise Plan progress as needed   Recommended Other Services None   Consulted and Agree with Plan of Care Patient  Problem List Patient Active Problem List   Diagnosis Date Noted  . Lumbar back pain 12/19/2014  . Facet syndrome, lumbar 06/29/2014  . Allergic rhinitis 03/31/2014  . Acute pharyngitis 03/31/2014  . Bronchitis 03/31/2014  . Cough 03/31/2014  . Back pain, thoracic 08/09/2012  . Screening for malignant neoplasm of the cervix 12/18/2011  . Routine gynecological examination 12/18/2011  . Left ankle pain 09/23/2011  . Acne 06/27/2011  . Birth control 06/27/2011  . Ingrown toenail 06/27/2011    GRAY,CHERYL,PT 12/28/2014, 8:44 AM  [facility] [address] [place], [place], [address] Phone: [phone]   Fax:  [phone]     PHYSICAL THERAPY DISCHARGE SUMMARY  Visits from Start of Care:13  Current functional level related to goals / functional outcomes: Patient has notified physical therapist she would like to be discharged.  She had a MRI showing herniated disk at level L5/S1. Patient is going to continue her core strengthening exercises.  Remaining deficits: See above  Education / Equipment: HEP Plan: Patient agrees to discharge.  Patient goals were partially met. Patient is being discharged due to the patient's request. Thank you for the referral.Cheryl Pearline Cables, PT 01/30/2015 10:06 AM    ?????

## 2014-12-30 ENCOUNTER — Other Ambulatory Visit: Payer: Self-pay | Admitting: Family Medicine

## 2014-12-30 NOTE — Telephone Encounter (Signed)
Med filled.  

## 2015-02-08 ENCOUNTER — Telehealth: Payer: Self-pay | Admitting: Family Medicine

## 2015-02-08 NOTE — Telephone Encounter (Signed)
pre visit letter mailed 02/03/15 °

## 2015-02-24 ENCOUNTER — Encounter: Payer: Self-pay | Admitting: Family Medicine

## 2015-02-24 ENCOUNTER — Ambulatory Visit (INDEPENDENT_AMBULATORY_CARE_PROVIDER_SITE_OTHER): Payer: 59 | Admitting: Family Medicine

## 2015-02-24 ENCOUNTER — Other Ambulatory Visit (HOSPITAL_COMMUNITY)
Admission: RE | Admit: 2015-02-24 | Discharge: 2015-02-24 | Disposition: A | Discharge status: Home / Self Care | Payer: 59 | Source: Ambulatory Visit | Attending: Family Medicine | Admitting: Family Medicine

## 2015-02-24 VITALS — BP 112/74 | HR 70 | Temp 98.3°F | Resp 16 | Ht 68.5 in | Wt 168.0 lb

## 2015-02-24 DIAGNOSIS — Z01419 Encounter for gynecological examination (general) (routine) without abnormal findings: Secondary | ICD-10-CM | POA: Insufficient documentation

## 2015-02-24 DIAGNOSIS — Z1151 Encounter for screening for human papillomavirus (HPV): Secondary | ICD-10-CM | POA: Insufficient documentation

## 2015-02-24 DIAGNOSIS — Z124 Encounter for screening for malignant neoplasm of cervix: Secondary | ICD-10-CM

## 2015-02-24 DIAGNOSIS — L700 Acne vulgaris: Secondary | ICD-10-CM

## 2015-02-24 LAB — CBC WITH DIFFERENTIAL/PLATELET
BASOS PCT: 0.4 % (ref 0.0–3.0)
Basophils Absolute: 0 10*3/uL (ref 0.0–0.1)
EOS PCT: 2.1 % (ref 0.0–5.0)
Eosinophils Absolute: 0.1 10*3/uL (ref 0.0–0.7)
HEMATOCRIT: 41.1 % (ref 36.0–46.0)
HEMOGLOBIN: 13.6 g/dL (ref 12.0–15.0)
LYMPHS PCT: 21.4 % (ref 12.0–46.0)
Lymphs Abs: 1.5 10*3/uL (ref 0.7–4.0)
MCHC: 33.2 g/dL (ref 30.0–36.0)
MCV: 91.1 fl (ref 78.0–100.0)
Monocytes Absolute: 0.4 10*3/uL (ref 0.1–1.0)
Monocytes Relative: 5.3 % (ref 3.0–12.0)
Neutro Abs: 5.1 10*3/uL (ref 1.4–7.7)
Neutrophils Relative %: 70.8 % (ref 43.0–77.0)
Platelets: 208 10*3/uL (ref 150.0–400.0)
RBC: 4.52 Mil/uL (ref 3.87–5.11)
RDW: 13.6 % (ref 11.5–15.5)
WBC: 7.2 10*3/uL (ref 4.0–10.5)

## 2015-02-24 LAB — BASIC METABOLIC PANEL
BUN: 13 mg/dL (ref 6–23)
CALCIUM: 9.3 mg/dL (ref 8.4–10.5)
CHLORIDE: 106 meq/L (ref 96–112)
CO2: 27 mEq/L (ref 19–32)
CREATININE: 0.7 mg/dL (ref 0.40–1.20)
GFR: 103.31 mL/min (ref >60.00)
Glucose, Bld: 73 mg/dL (ref 70–99)
Potassium: 3.6 mEq/L (ref 3.5–5.1)
SODIUM: 142 meq/L (ref 135–145)

## 2015-02-24 LAB — LIPID PANEL
Cholesterol: 164 mg/dL (ref 0–200)
HDL: 60.3 mg/dL (ref >39.00)
LDL Cholesterol: 87 mg/dL (ref 0–99)
NONHDL: 103.84
Total CHOL/HDL Ratio: 3
Triglycerides: 84 mg/dL (ref 0.0–149.0)
VLDL: 16.8 mg/dL (ref 0.0–40.0)

## 2015-02-24 LAB — HEPATIC FUNCTION PANEL
ALT: 10 U/L (ref 0–35)
AST: 16 U/L (ref 0–37)
Albumin: 4.1 g/dL (ref 3.5–5.2)
Alkaline Phosphatase: 41 U/L (ref 39–117)
BILIRUBIN TOTAL: 0.6 mg/dL (ref 0.2–1.2)
Bilirubin, Direct: 0.1 mg/dL (ref 0.0–0.3)
Total Protein: 7.1 g/dL (ref 6.0–8.3)

## 2015-02-24 LAB — VITAMIN D 25 HYDROXY (VIT D DEFICIENCY, FRACTURES): VITD: 33.19 ng/mL (ref 30.00–100.00)

## 2015-02-24 LAB — TSH: TSH: 1.84 u[IU]/mL (ref 0.35–4.50)

## 2015-02-24 MED ORDER — ADAPALENE-BENZOYL PEROXIDE 0.1-2.5 % EX GEL: 1.0000 "application " | Freq: Every day | CUTANEOUS | Status: DC

## 2015-02-24 NOTE — Progress Notes (Signed)
Pre visit review using our clinic review tool, if applicable. No additional management support is needed unless otherwise documented below in the visit note. 

## 2015-02-24 NOTE — Progress Notes (Signed)
   Subjective:    Patient ID: Teresa Graves, female    DOB: 23-Mar-1986, 29 y.o.   MRN: [id number]  HPI CPE- due for pap   Review of Systems Patient reports no vision/ hearing changes, adenopathy,fever, weight change,  persistant/recurrent hoarseness , swallowing issues, chest pain, palpitations, edema, persistant/recurrent cough, hemoptysis, dyspnea (rest/exertional/paroxysmal nocturnal), gastrointestinal bleeding (melena, rectal bleeding), abdominal pain, significant heartburn, bowel changes, GU symptoms (dysuria, hematuria, incontinence), Gyn symptoms (abnormal  bleeding, pain),  syncope, focal weakness, memory loss, numbness & tingling, skin/hair/nail changes, abnormal bruising or bleeding, anxiety, or depression.     Objective:   Physical Exam  General Appearance:    Alert, cooperative, no distress, appears stated age  Head:    Normocephalic, without obvious abnormality, atraumatic  Eyes:    PERRL, conjunctiva/corneas clear, EOM's intact, fundi    benign, both eyes  Ears:    Normal TM's and external ear canals, both ears  Nose:   Nares normal, septum midline, mucosa normal, no drainage    or sinus tenderness  Throat:   Lips, mucosa, and tongue normal; teeth and gums normal  Neck:   Supple, symmetrical, trachea midline, no adenopathy;    Thyroid: no enlargement/tenderness/nodules  Back:     Symmetric, no curvature, ROM normal, no CVA tenderness  Lungs:     Clear to auscultation bilaterally, respirations unlabored  Chest Wall:    No tenderness or deformity   Heart:    Regular rate and rhythm, S1 and S2 normal, no murmur, rub   or gallop  Breast Exam:    No tenderness, masses, or nipple abnormality  Abdomen:     Soft, non-tender, bowel sounds active all four quadrants,    no masses, no organomegaly  Genitalia:    External genitalia normal, cervix normal in appearance, no CMT, uterus in normal size and position, adnexa w/out mass or tenderness, mucosa pink and moist, no lesions or  discharge present  Rectal:    Normal external appearance  Extremities:   Extremities normal, atraumatic, no cyanosis or edema  Pulses:   2+ and symmetric all extremities  Skin:   Skin color, texture, turgor normal, no rashes or lesions  Lymph nodes:   Cervical, supraclavicular, and axillary nodes normal  Neurologic:   CNII-XII intact, normal strength, sensation and reflexes    throughout          Assessment & Plan:

## 2015-02-24 NOTE — Patient Instructions (Signed)
Follow up in 1 year or as needed We'll notify you of your lab results and make any changes if needed Keep up the good work on healthy diet and regular exercise- you look great! Stop the Benzaclin and start the Epiduo once daily Call with any questions or concerns Happy Labor Day!!

## 2015-02-26 NOTE — Assessment & Plan Note (Signed)
Pap collected. 

## 2015-02-26 NOTE — Assessment & Plan Note (Signed)
Pt's PE WNL.  Pap collected today.  Check labs.  Anticipatory guidance provided.  

## 2015-02-26 NOTE — Assessment & Plan Note (Signed)
Chronic problem for pt.  Benzaclin is no longer effective.  Will switch to Epiduo.  Pt expressed understanding and is in agreement w/ plan.

## 2015-03-01 LAB — CYTOLOGY - PAP

## 2015-06-02 ENCOUNTER — Other Ambulatory Visit: Payer: Self-pay | Admitting: Family Medicine

## 2015-06-02 NOTE — Telephone Encounter (Signed)
Medication filled to pharmacy as requested.   

## 2015-06-07 ENCOUNTER — Telehealth: Payer: Self-pay | Admitting: Medical

## 2015-06-07 NOTE — Telephone Encounter (Signed)
One year since cxr ordered. Pt never got done. She has seen pcp since I last saw. At this point in light of fact 1 yr passed and she saw pcp. Will not remind her to get.

## 2015-06-22 ENCOUNTER — Other Ambulatory Visit: Payer: Self-pay | Admitting: Family Medicine

## 2015-07-20 MED FILL — MONO-LINYAH 28 TABLET: 0.25-35 | 84 days supply | Qty: 84 | Fill #0

## 2015-08-25 ENCOUNTER — Encounter: Payer: Self-pay | Admitting: Family Medicine

## 2015-08-25 DIAGNOSIS — L709 Acne, unspecified: Secondary | ICD-10-CM

## 2015-08-28 NOTE — Telephone Encounter (Signed)
Referral placed.

## 2015-08-30 ENCOUNTER — Encounter: Payer: Self-pay | Admitting: Family Medicine

## 2015-09-05 DIAGNOSIS — L700 Acne vulgaris: Secondary | ICD-10-CM | POA: Diagnosis not present

## 2015-09-05 DIAGNOSIS — Z808 Family history of malignant neoplasm of other organs or systems: Secondary | ICD-10-CM | POA: Diagnosis not present

## 2015-09-07 MED FILL — TRETINOIN 0.025% CREAM: 0.025 | 25 days supply | Qty: 45 | Fill #0

## 2015-12-19 ENCOUNTER — Encounter: Payer: Self-pay | Admitting: Family Medicine

## 2015-12-20 ENCOUNTER — Encounter: Payer: Self-pay | Admitting: Family Medicine

## 2015-12-20 ENCOUNTER — Ambulatory Visit (INDEPENDENT_AMBULATORY_CARE_PROVIDER_SITE_OTHER): Payer: 59 | Admitting: Family Medicine

## 2015-12-20 VITALS — BP 110/80 | HR 65 | Temp 98.0°F | Resp 16 | Ht 69.0 in | Wt 170.0 lb

## 2015-12-20 DIAGNOSIS — Z3201 Encounter for pregnancy test, result positive: Secondary | ICD-10-CM

## 2015-12-20 NOTE — Progress Notes (Signed)
   Subjective:    Patient ID: Teresa Graves, female    DOB: 13-Mar-1986, 30 y.o.   MRN: [id number]  HPI Late period- pt's LMP 11/08/15.  Stopped OCPs in April.  Pt and husband are excited.  Pt prefers [facility].  Pt started PNV 3 days ago.     Review of Systems For ROS see HPI     Objective:   Physical Exam  Constitutional: She is oriented to person, place, and time. She appears well-developed and well-nourished. No distress.  HENT:  Head: Normocephalic and atraumatic.  Neurological: She is alert and oriented to person, place, and time.  Skin: Skin is warm and dry.  Psychiatric: She has a normal mood and affect. Her behavior is normal. Thought content normal.  Appropriately excited and anxious  Vitals reviewed.         Assessment & Plan:  + Pregnancy Test- test + in office today.  Pt is taking PNV.  Needs OB referral- placed.  Reviewed dietary and medication safety in pregnancy.  Questions answered for pt and husband.

## 2015-12-20 NOTE — Progress Notes (Signed)
Pre visit review using our clinic review tool, if applicable. No additional management support is needed unless otherwise documented below in the visit note. 

## 2015-12-20 NOTE — Patient Instructions (Signed)
Follow up as needed Drink plenty of fluids Continue the Prenatal Vitamins daily No alcohol, sushi, and limit processed meats Tylenol as needed We'll call you with your OB appt Call with any questions or concerns CONGRATS!!!!

## 2016-01-02 DIAGNOSIS — N911 Secondary amenorrhea: Secondary | ICD-10-CM | POA: Diagnosis not present

## 2016-01-11 DIAGNOSIS — Z36 Encounter for antenatal screening of mother: Secondary | ICD-10-CM | POA: Diagnosis not present

## 2016-01-11 DIAGNOSIS — Z3481 Encounter for supervision of other normal pregnancy, first trimester: Secondary | ICD-10-CM | POA: Diagnosis not present

## 2016-01-11 DIAGNOSIS — Z13228 Encounter for screening for other metabolic disorders: Secondary | ICD-10-CM | POA: Diagnosis not present

## 2016-01-11 DIAGNOSIS — Z3401 Encounter for supervision of normal first pregnancy, first trimester: Secondary | ICD-10-CM | POA: Diagnosis not present

## 2016-01-11 DIAGNOSIS — Z3A09 9 weeks gestation of pregnancy: Secondary | ICD-10-CM | POA: Diagnosis not present

## 2016-01-11 DIAGNOSIS — Z130 Encounter for screening for diseases of the blood and blood-forming organs and certain disorders involving the immune mechanism: Secondary | ICD-10-CM | POA: Diagnosis not present

## 2016-01-11 LAB — OB RESULTS CONSOLE GC/CHLAMYDIA
Chlamydia: NEGATIVE
GC PROBE AMP, GENITAL: NEGATIVE

## 2016-01-11 LAB — OB RESULTS CONSOLE ANTIBODY SCREEN: ANTIBODY SCREEN: NEGATIVE

## 2016-01-11 LAB — OB RESULTS CONSOLE ABO/RH: RH Type: POSITIVE

## 2016-01-11 LAB — OB RESULTS CONSOLE HEPATITIS B SURFACE ANTIGEN: Hepatitis B Surface Ag: NEGATIVE

## 2016-01-11 LAB — OB RESULTS CONSOLE HIV ANTIBODY (ROUTINE TESTING): HIV: NONREACTIVE

## 2016-01-11 LAB — OB RESULTS CONSOLE RPR: RPR: NONREACTIVE

## 2016-01-11 LAB — OB RESULTS CONSOLE GBS: STREP GROUP B AG: POSITIVE

## 2016-01-11 LAB — OB RESULTS CONSOLE RUBELLA ANTIBODY, IGM: Rubella: IMMUNE

## 2016-01-18 MED FILL — AMOXICILLIN 500 MG CAPSULE: 500 | 5 days supply | Qty: 10 | Fill #0

## 2016-01-22 DIAGNOSIS — Z3401 Encounter for supervision of normal first pregnancy, first trimester: Secondary | ICD-10-CM | POA: Diagnosis not present

## 2016-01-22 DIAGNOSIS — Z113 Encounter for screening for infections with a predominantly sexual mode of transmission: Secondary | ICD-10-CM | POA: Diagnosis not present

## 2016-01-22 DIAGNOSIS — Z36 Encounter for antenatal screening of mother: Secondary | ICD-10-CM | POA: Diagnosis not present

## 2016-02-06 DIAGNOSIS — Z3A12 12 weeks gestation of pregnancy: Secondary | ICD-10-CM | POA: Diagnosis not present

## 2016-02-06 DIAGNOSIS — Z3491 Encounter for supervision of normal pregnancy, unspecified, first trimester: Secondary | ICD-10-CM | POA: Diagnosis not present

## 2016-02-06 DIAGNOSIS — Z36 Encounter for antenatal screening of mother: Secondary | ICD-10-CM | POA: Diagnosis not present

## 2016-03-21 DIAGNOSIS — Z36 Encounter for antenatal screening of mother: Secondary | ICD-10-CM | POA: Diagnosis not present

## 2016-03-21 DIAGNOSIS — Z3400 Encounter for supervision of normal first pregnancy, unspecified trimester: Secondary | ICD-10-CM | POA: Diagnosis not present

## 2016-04-11 DIAGNOSIS — Z3400 Encounter for supervision of normal first pregnancy, unspecified trimester: Secondary | ICD-10-CM | POA: Diagnosis not present

## 2016-04-11 DIAGNOSIS — O365920 Maternal care for other known or suspected poor fetal growth, second trimester, not applicable or unspecified: Secondary | ICD-10-CM | POA: Diagnosis not present

## 2016-04-11 DIAGNOSIS — Z3A22 22 weeks gestation of pregnancy: Secondary | ICD-10-CM | POA: Diagnosis not present

## 2016-04-15 ENCOUNTER — Other Ambulatory Visit (HOSPITAL_COMMUNITY): Payer: Self-pay | Admitting: Obstetrics & Gynecology

## 2016-04-15 DIAGNOSIS — Z3689 Encounter for other specified antenatal screening: Secondary | ICD-10-CM

## 2016-04-15 DIAGNOSIS — O26842 Uterine size-date discrepancy, second trimester: Secondary | ICD-10-CM

## 2016-04-16 DIAGNOSIS — Z3480 Encounter for supervision of other normal pregnancy, unspecified trimester: Secondary | ICD-10-CM | POA: Diagnosis not present

## 2016-04-17 ENCOUNTER — Encounter (HOSPITAL_COMMUNITY): Payer: Self-pay | Admitting: Obstetrics & Gynecology

## 2016-04-23 ENCOUNTER — Ambulatory Visit (HOSPITAL_COMMUNITY): Payer: 59

## 2016-04-24 ENCOUNTER — Ambulatory Visit (HOSPITAL_COMMUNITY): Payer: 59

## 2016-04-25 ENCOUNTER — Ambulatory Visit (HOSPITAL_COMMUNITY)
Admission: RE | Admit: 2016-04-25 | Discharge: 2016-04-25 | Disposition: A | Discharge status: Home / Self Care | Payer: 59 | Source: Ambulatory Visit | Attending: Obstetrics & Gynecology | Admitting: Obstetrics & Gynecology

## 2016-04-25 ENCOUNTER — Other Ambulatory Visit (HOSPITAL_COMMUNITY): Payer: Self-pay | Admitting: Obstetrics & Gynecology

## 2016-04-25 ENCOUNTER — Encounter (HOSPITAL_COMMUNITY): Payer: Self-pay

## 2016-04-25 ENCOUNTER — Ambulatory Visit (HOSPITAL_COMMUNITY): Payer: 59

## 2016-04-25 DIAGNOSIS — O26843 Uterine size-date discrepancy, third trimester: Secondary | ICD-10-CM | POA: Diagnosis not present

## 2016-04-25 DIAGNOSIS — Z3689 Encounter for other specified antenatal screening: Secondary | ICD-10-CM

## 2016-04-25 DIAGNOSIS — O26842 Uterine size-date discrepancy, second trimester: Secondary | ICD-10-CM | POA: Diagnosis not present

## 2016-04-25 DIAGNOSIS — Z3A24 24 weeks gestation of pregnancy: Secondary | ICD-10-CM

## 2016-04-25 DIAGNOSIS — Z363 Encounter for antenatal screening for malformations: Secondary | ICD-10-CM | POA: Diagnosis not present

## 2016-04-25 DIAGNOSIS — O365920 Maternal care for other known or suspected poor fetal growth, second trimester, not applicable or unspecified: Secondary | ICD-10-CM | POA: Diagnosis not present

## 2016-04-25 IMAGING — US US MFM UA CORD DOPPLER
1 series · 14 of 28 positions shown · non-contrast
Comparison: none

[Series 1: us mfm ua cord doppler · 109 acquisitions, 14 frames shown]
[im 5/109]
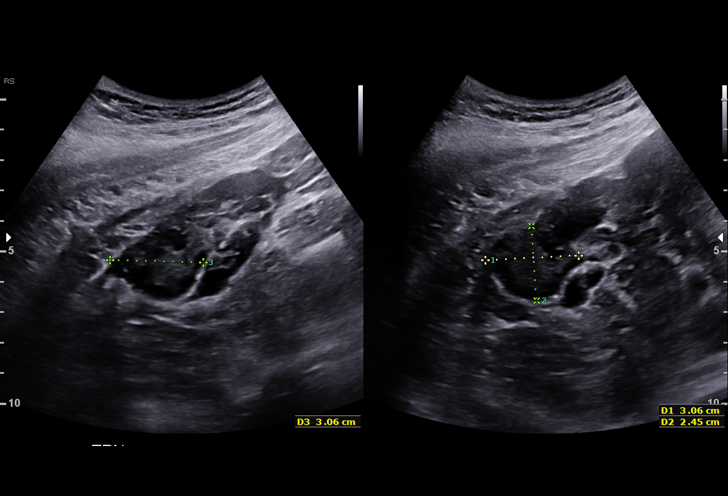
[im 13/109]
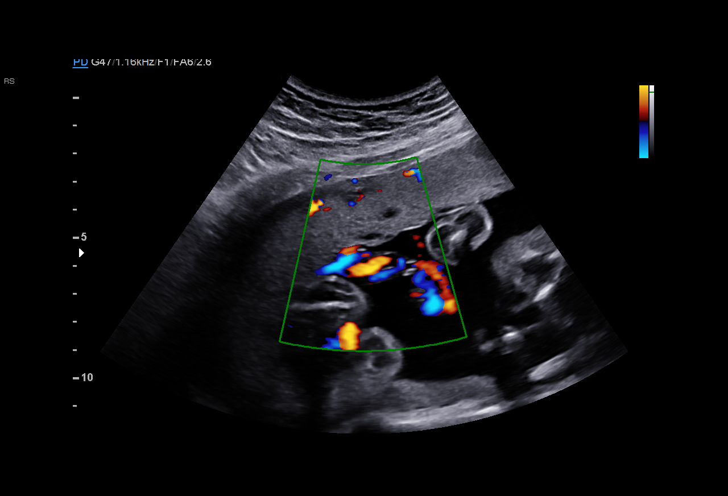
[im 21/109]
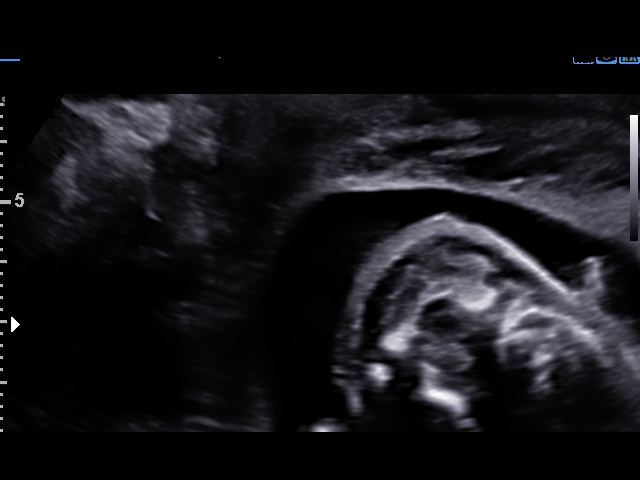
[im 29/109]
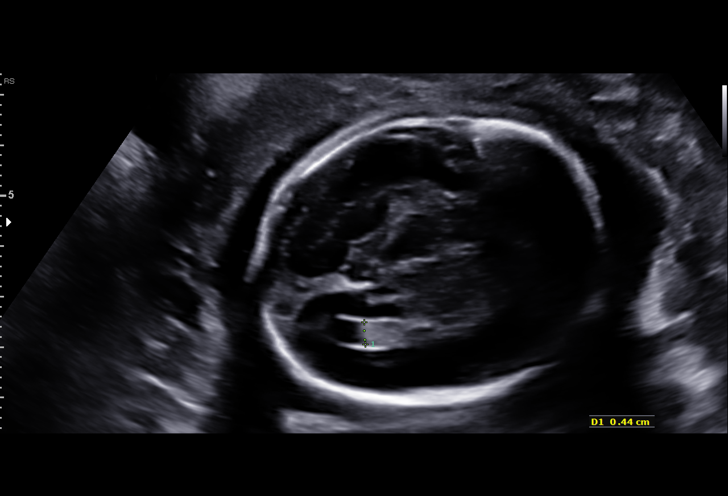
[im 37/109]
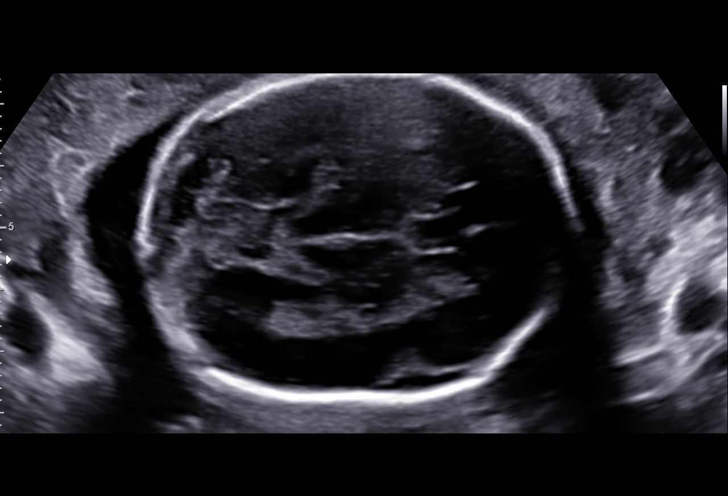
[im 45/109]
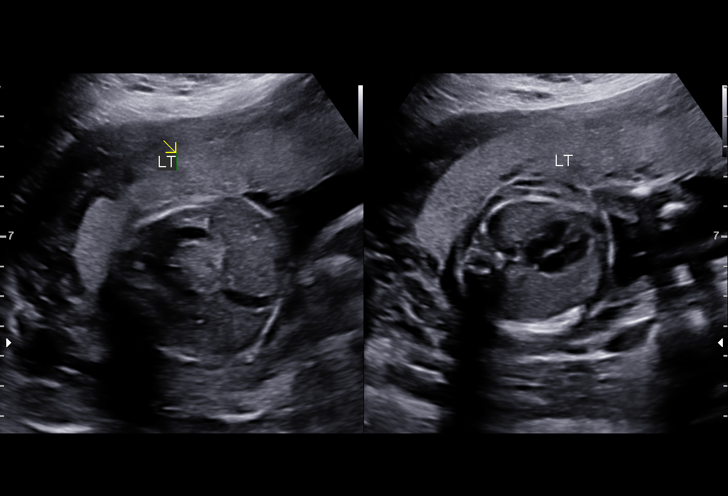
[im 53/109]
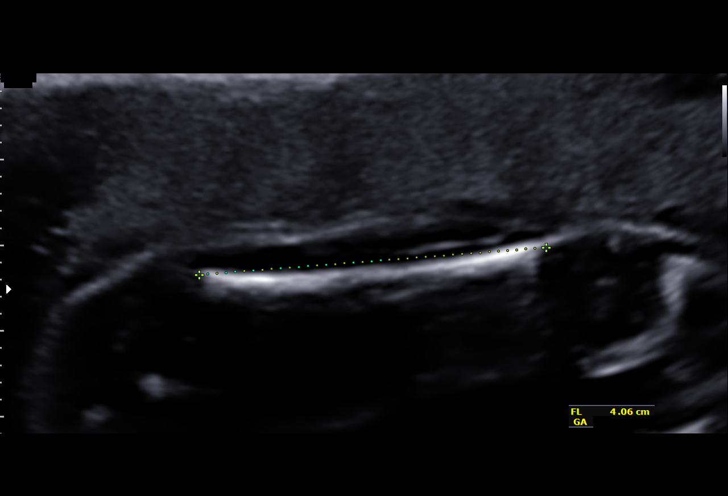
[im 61/109]
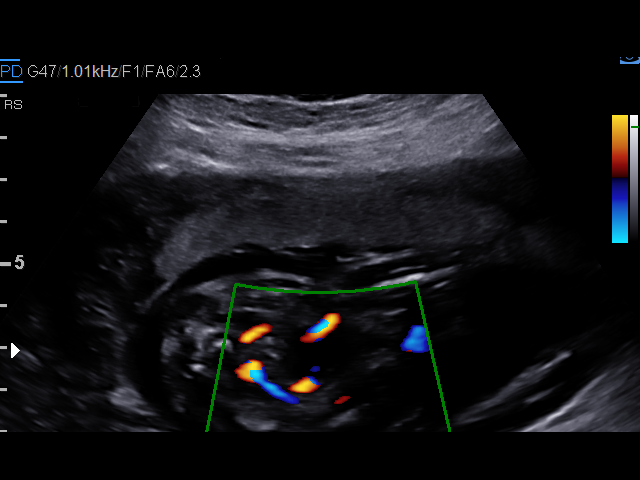
[im 69/109]
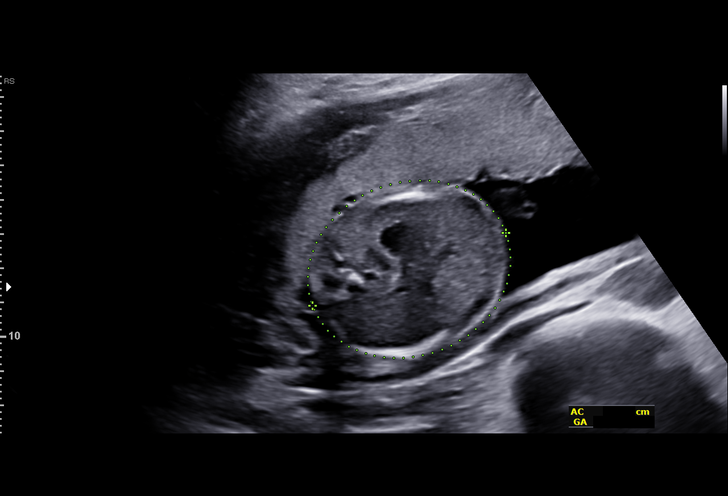
[im 77/109]
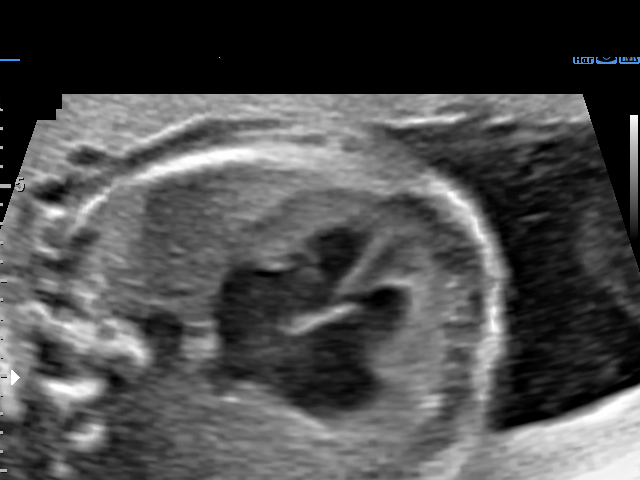
[im 85/109]
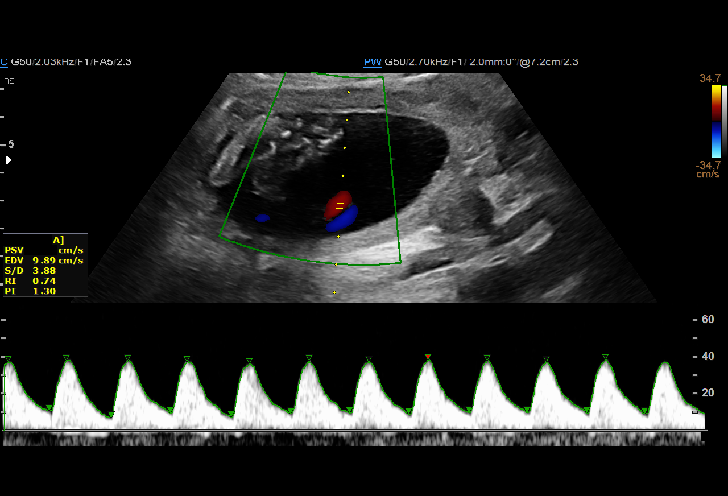
[im 93/109]
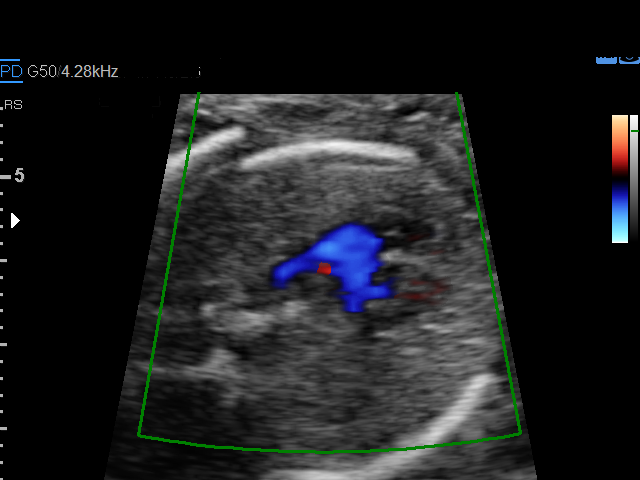
[im 101/109]
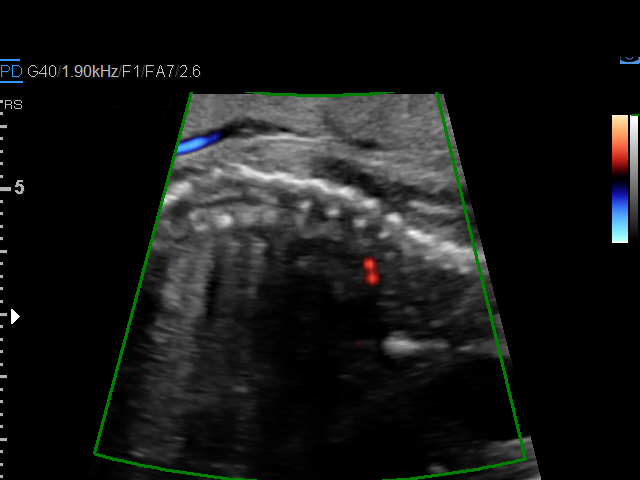
[im 109/109]
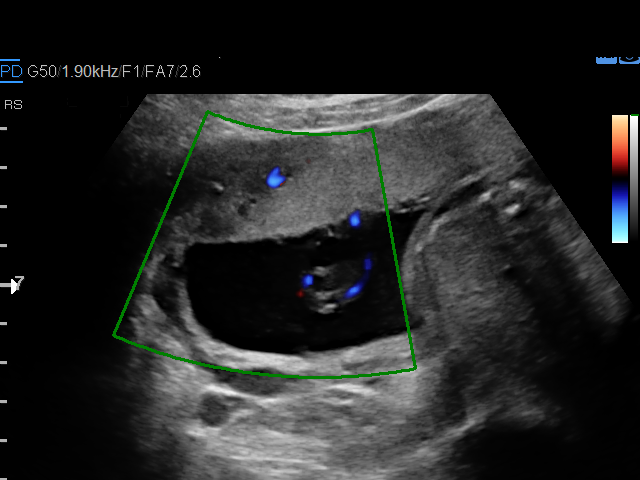

[14 of 28 positions shown; findings below may reference images not displayed]

[REDACTED]. [HOSPITAL]
DO

1  ANTONINO             [PHONE_NUMBER]      [PHONE_NUMBER]     [PHONE_NUMBER]
2  ANTONINO             [PHONE_NUMBER]      [PHONE_NUMBER]     [PHONE_NUMBER]
Indications

24 weeks gestation of pregnancy
Encounter for antenatal screening for          [K8]
malformations
Uterine size-date discrepancy, second          [K8]
trimester
OB History

Gravidity:    1
Fetal Evaluation

Num Of Fetuses:     1
Fetal Heart         138
Rate(bpm):
Cardiac Activity:   Observed
Presentation:       Cephalic
Placenta:           Anterior, above cervical os
P. Cord Insertion:  Visualized, central

Amniotic Fluid
AFI FV:      Subjectively within normal limits

Largest Pocket(cm)
4.3
Biometry

BPD:      53.4  mm     G. Age:  22w 2d          2  %    CI:         69.19  %    70 - 86
FL/HC:       19.7  %    18.7 -
HC:        205  mm     G. Age:  22w 4d        < 3  %    HC/AC:       1.16       1.05 -
AC:      176.8  mm     G. Age:  22w 4d          7  %    FL/BPD:      75.7  %    71 - 87
FL:       40.4  mm     G. Age:  23w 0d         10  %    FL/AC:       22.9  %    20 - 24
HUM:      35.6  mm     G. Age:  22w 3d        < 5  %
CER:      24.5  mm     G. Age:  22w 4d         13  %

CM:        4.5  mm

Est. FW:     531   gm     1 lb 3 oz     23  %
Gestational Age

LMP:           24w 1d        Date:  [DATE]                 EDD:    [DATE]
U/S Today:     22w 4d                                        EDD:    [DATE]
Best:          24w 1d     Det. By:  LMP  ([DATE])          EDD:    [DATE]
Anatomy

Cranium:               Appears normal         Aortic Arch:            Appears normal
Cavum:                 Appears normal         Ductal Arch:            Appears normal
Ventricles:            Appears normal         Diaphragm:              Not well visualized
Choroid Plexus:        Appears normal         Stomach:                Appears normal, left
sided
Cerebellum:            Appears normal         Abdomen:                Appears normal
Posterior Fossa:       Appears normal         Abdominal Wall:         Not well visualized
Nuchal Fold:           Not applicable (>20    Cord Vessels:           Appears normal (3
wks GA)                                        vessel cord)
Face:                  Orbits nl; profile not Kidneys:                Appear normal
well visualized
Lips:                  Appears normal         Bladder:                Appears normal
Thoracic:              Appears normal         Spine:                  Appears normal
Heart:                 Appears normal         Upper Extremities:      Appears normal
(4CH, axis, and situs
RVOT:                  Appears normal         Lower Extremities:      Appears normal
LVOT:                  Appears normal

Other:  Fetus appears to be a female. Heels visualized. Technically difficult
due to fetal position.
Doppler - Fetal Vessels

Umbilical Artery
S/D     %tile     RI              PI               PSV   ADFV    RDFV
(cm/s)
3.6       55   0.72            1.22               37.5     No       No

Cervix Uterus Adnexa

Cervix
Length:            4.3  cm.
Normal appearance by transabdominal scan.

Uterus
No abnormality visualized.

Left Ovary
Within normal limits.

Right Ovary
Within normal limits.

Adnexa:       No abnormality visualized. No adnexal mass
visualized.
Impression

SIUP at 24+1 weeks
Normal detailed fetal anatomy; limited views of profile,
diaphragm and CI
Normal amniotic fluid volume
Measurements consistent with LMP dating; EFW at the 23rd
%tile; AC at the 7th %tile
UA dopplers were normal for this GA
(+ movement, + breathing, + tone)

The US findings were shared with Ms. ANTONINO and her
husband. The implications of early growth restriction were
discussed in detail including possible etiologies: placental
insufficiency, aneuploidy and infection. They were offered cell
free DNA screening, amniocentesis and maternal viral titers.
After careful consideration, Ms. ANTONINO declined further
testing.
Recommendations

Follow-up ultrasound for growth in 3 weeks

## 2016-04-29 ENCOUNTER — Encounter (HOSPITAL_COMMUNITY): Payer: Self-pay

## 2016-04-29 ENCOUNTER — Other Ambulatory Visit (HOSPITAL_COMMUNITY): Payer: Self-pay

## 2016-05-07 DIAGNOSIS — Z3480 Encounter for supervision of other normal pregnancy, unspecified trimester: Secondary | ICD-10-CM | POA: Diagnosis not present

## 2016-05-07 DIAGNOSIS — Z23 Encounter for immunization: Secondary | ICD-10-CM | POA: Diagnosis not present

## 2016-05-07 DIAGNOSIS — Z3400 Encounter for supervision of normal first pregnancy, unspecified trimester: Secondary | ICD-10-CM | POA: Diagnosis not present

## 2016-05-07 DIAGNOSIS — D509 Iron deficiency anemia, unspecified: Secondary | ICD-10-CM | POA: Diagnosis not present

## 2016-05-08 ENCOUNTER — Other Ambulatory Visit (HOSPITAL_COMMUNITY): Payer: 59

## 2016-05-17 ENCOUNTER — Ambulatory Visit (HOSPITAL_COMMUNITY)
Admission: RE | Admit: 2016-05-17 | Discharge: 2016-05-17 | Disposition: A | Discharge status: Home / Self Care | Payer: 59 | Source: Ambulatory Visit | Attending: Obstetrics & Gynecology | Admitting: Obstetrics & Gynecology

## 2016-05-17 ENCOUNTER — Other Ambulatory Visit (HOSPITAL_COMMUNITY): Payer: Self-pay | Admitting: Maternal and Fetal Medicine

## 2016-05-17 ENCOUNTER — Encounter (HOSPITAL_COMMUNITY): Payer: Self-pay

## 2016-05-17 DIAGNOSIS — O365920 Maternal care for other known or suspected poor fetal growth, second trimester, not applicable or unspecified: Secondary | ICD-10-CM | POA: Diagnosis not present

## 2016-05-17 DIAGNOSIS — Z3A27 27 weeks gestation of pregnancy: Secondary | ICD-10-CM

## 2016-05-17 DIAGNOSIS — Z362 Encounter for other antenatal screening follow-up: Secondary | ICD-10-CM

## 2016-05-17 DIAGNOSIS — Z3689 Encounter for other specified antenatal screening: Secondary | ICD-10-CM

## 2016-05-20 ENCOUNTER — Other Ambulatory Visit (HOSPITAL_COMMUNITY): Payer: Self-pay | Admitting: *Deleted

## 2016-05-20 DIAGNOSIS — O365990 Maternal care for other known or suspected poor fetal growth, unspecified trimester, not applicable or unspecified: Secondary | ICD-10-CM

## 2016-05-22 ENCOUNTER — Ambulatory Visit (HOSPITAL_COMMUNITY): Payer: 59

## 2016-06-03 DIAGNOSIS — Z3A29 29 weeks gestation of pregnancy: Secondary | ICD-10-CM | POA: Diagnosis not present

## 2016-06-03 DIAGNOSIS — O365930 Maternal care for other known or suspected poor fetal growth, third trimester, not applicable or unspecified: Secondary | ICD-10-CM | POA: Diagnosis not present

## 2016-06-03 DIAGNOSIS — Z3400 Encounter for supervision of normal first pregnancy, unspecified trimester: Secondary | ICD-10-CM | POA: Diagnosis not present

## 2016-06-06 ENCOUNTER — Encounter (HOSPITAL_COMMUNITY): Payer: Self-pay

## 2016-06-06 ENCOUNTER — Ambulatory Visit (HOSPITAL_COMMUNITY): Payer: 59

## 2016-06-11 DIAGNOSIS — O365930 Maternal care for other known or suspected poor fetal growth, third trimester, not applicable or unspecified: Secondary | ICD-10-CM | POA: Diagnosis not present

## 2016-06-11 DIAGNOSIS — Z3A30 30 weeks gestation of pregnancy: Secondary | ICD-10-CM | POA: Diagnosis not present

## 2016-06-11 DIAGNOSIS — Z3400 Encounter for supervision of normal first pregnancy, unspecified trimester: Secondary | ICD-10-CM | POA: Diagnosis not present

## 2016-06-19 DIAGNOSIS — O365930 Maternal care for other known or suspected poor fetal growth, third trimester, not applicable or unspecified: Secondary | ICD-10-CM | POA: Diagnosis not present

## 2016-06-19 DIAGNOSIS — Z3A32 32 weeks gestation of pregnancy: Secondary | ICD-10-CM | POA: Diagnosis not present

## 2016-06-19 DIAGNOSIS — Z3400 Encounter for supervision of normal first pregnancy, unspecified trimester: Secondary | ICD-10-CM | POA: Diagnosis not present

## 2016-06-24 NOTE — L&D Delivery Note (Signed)
Delivery Note At 3:01 AM a viable female was delivered via Vaginal, Spontaneous Delivery (Presentation: ROA).  APGAR: 7, 9; weight pending.  Placenta status: S/I/3VC with marginal cord insertion noted. Placenta sent to pathology. Complications: none.  Cord pH: n/a  Anesthesia:  CLEA   Episiotomy: None Lacerations: Vaginal; Bilateral Labial Suture Repair: 3.0 vicryl rapide Est. Blood Loss (mL): 200  Mom to postpartum.  Baby to Couplet care / Skin to Skin.  Teresa Graves 08/09/2016, 3:27 AM

## 2016-07-02 DIAGNOSIS — Z3400 Encounter for supervision of normal first pregnancy, unspecified trimester: Secondary | ICD-10-CM | POA: Diagnosis not present

## 2016-07-02 DIAGNOSIS — O365930 Maternal care for other known or suspected poor fetal growth, third trimester, not applicable or unspecified: Secondary | ICD-10-CM | POA: Diagnosis not present

## 2016-07-02 DIAGNOSIS — Z3A33 33 weeks gestation of pregnancy: Secondary | ICD-10-CM | POA: Diagnosis not present

## 2016-07-18 DIAGNOSIS — Z3400 Encounter for supervision of normal first pregnancy, unspecified trimester: Secondary | ICD-10-CM | POA: Diagnosis not present

## 2016-07-18 DIAGNOSIS — Z3A36 36 weeks gestation of pregnancy: Secondary | ICD-10-CM | POA: Diagnosis not present

## 2016-07-18 DIAGNOSIS — O365930 Maternal care for other known or suspected poor fetal growth, third trimester, not applicable or unspecified: Secondary | ICD-10-CM | POA: Diagnosis not present

## 2016-07-23 DIAGNOSIS — Z3A36 36 weeks gestation of pregnancy: Secondary | ICD-10-CM | POA: Diagnosis not present

## 2016-07-23 DIAGNOSIS — O365930 Maternal care for other known or suspected poor fetal growth, third trimester, not applicable or unspecified: Secondary | ICD-10-CM | POA: Diagnosis not present

## 2016-07-23 DIAGNOSIS — Z3400 Encounter for supervision of normal first pregnancy, unspecified trimester: Secondary | ICD-10-CM | POA: Diagnosis not present

## 2016-07-26 ENCOUNTER — Telehealth (HOSPITAL_COMMUNITY): Payer: Self-pay | Admitting: *Deleted

## 2016-07-26 ENCOUNTER — Encounter (HOSPITAL_COMMUNITY): Payer: Self-pay | Admitting: *Deleted

## 2016-07-26 NOTE — Telephone Encounter (Signed)
Preadmission screen  

## 2016-07-31 DIAGNOSIS — Z3A38 38 weeks gestation of pregnancy: Secondary | ICD-10-CM | POA: Diagnosis not present

## 2016-07-31 DIAGNOSIS — O365930 Maternal care for other known or suspected poor fetal growth, third trimester, not applicable or unspecified: Secondary | ICD-10-CM | POA: Diagnosis not present

## 2016-07-31 DIAGNOSIS — Z3400 Encounter for supervision of normal first pregnancy, unspecified trimester: Secondary | ICD-10-CM | POA: Diagnosis not present

## 2016-08-08 ENCOUNTER — Inpatient Hospital Stay (HOSPITAL_COMMUNITY)
Admission: RE | Admit: 2016-08-08 | Discharge: 2016-08-11 | DRG: 775 | Disposition: A | Discharge status: Home / Self Care | Payer: 59 | Source: Ambulatory Visit | Attending: Obstetrics & Gynecology | Admitting: Obstetrics & Gynecology

## 2016-08-08 ENCOUNTER — Inpatient Hospital Stay (HOSPITAL_COMMUNITY): Payer: 59 | Admitting: Anesthesiology

## 2016-08-08 ENCOUNTER — Encounter (HOSPITAL_COMMUNITY): Payer: Self-pay

## 2016-08-08 DIAGNOSIS — O365990 Maternal care for other known or suspected poor fetal growth, unspecified trimester, not applicable or unspecified: Secondary | ICD-10-CM | POA: Diagnosis present

## 2016-08-08 DIAGNOSIS — Z8249 Family history of ischemic heart disease and other diseases of the circulatory system: Secondary | ICD-10-CM

## 2016-08-08 DIAGNOSIS — O99824 Streptococcus B carrier state complicating childbirth: Secondary | ICD-10-CM | POA: Diagnosis present

## 2016-08-08 DIAGNOSIS — O43123 Velamentous insertion of umbilical cord, third trimester: Secondary | ICD-10-CM | POA: Diagnosis present

## 2016-08-08 DIAGNOSIS — O365930 Maternal care for other known or suspected poor fetal growth, third trimester, not applicable or unspecified: Secondary | ICD-10-CM | POA: Diagnosis present

## 2016-08-08 DIAGNOSIS — Z3A39 39 weeks gestation of pregnancy: Secondary | ICD-10-CM | POA: Diagnosis not present

## 2016-08-08 DIAGNOSIS — Z3490 Encounter for supervision of normal pregnancy, unspecified, unspecified trimester: Secondary | ICD-10-CM

## 2016-08-08 DIAGNOSIS — Z3493 Encounter for supervision of normal pregnancy, unspecified, third trimester: Secondary | ICD-10-CM | POA: Diagnosis present

## 2016-08-08 LAB — CBC
HCT: 37.7 % (ref 36.0–46.0)
HEMOGLOBIN: 13 g/dL (ref 12.0–15.0)
MCH: 31.2 pg (ref 26.0–34.0)
MCHC: 34.5 g/dL (ref 30.0–36.0)
MCV: 90.4 fL (ref 78.0–100.0)
Platelets: 182 10*3/uL (ref 150–400)
RBC: 4.17 MIL/uL (ref 3.87–5.11)
RDW: 12.7 % (ref 11.5–15.5)
WBC: 11.4 10*3/uL — ABNORMAL HIGH (ref 4.0–10.5)

## 2016-08-08 LAB — TYPE AND SCREEN
ABO/RH(D): AB POS
ANTIBODY SCREEN: NEGATIVE

## 2016-08-08 LAB — ABO/RH: ABO/RH(D): AB POS

## 2016-08-08 MED ORDER — LIDOCAINE HCL (PF) 1 % IJ SOLN
INTRAMUSCULAR | Status: DC | PRN
  Administered 2016-08-08 (×2): 5 mL

## 2016-08-08 MED ORDER — FENTANYL CITRATE (PF) 100 MCG/2ML IJ SOLN
50.0000 ug | INTRAMUSCULAR | Status: DC | PRN
  Administered 2016-08-08: 100 ug via INTRAVENOUS
  Filled 2016-08-08: qty 2

## 2016-08-08 MED ORDER — DIPHENHYDRAMINE HCL 50 MG/ML IJ SOLN: 12.5000 mg | INTRAMUSCULAR | Status: DC | PRN

## 2016-08-08 MED ORDER — LACTATED RINGERS IV SOLN
500.0000 mL | INTRAVENOUS | Status: DC | PRN
  Administered 2016-08-09: 500 mL via INTRAVENOUS

## 2016-08-08 MED ORDER — PHENYLEPHRINE 40 MCG/ML (10ML) SYRINGE FOR IV PUSH (FOR BLOOD PRESSURE SUPPORT)
80.0000 ug | PREFILLED_SYRINGE | INTRAVENOUS | Status: DC | PRN
  Filled 2016-08-08: qty 5

## 2016-08-08 MED ORDER — LIDOCAINE HCL (PF) 1 % IJ SOLN
30.0000 mL | INTRAMUSCULAR | Status: DC | PRN
  Filled 2016-08-08: qty 30

## 2016-08-08 MED ORDER — TERBUTALINE SULFATE 1 MG/ML IJ SOLN
0.2500 mg | Freq: Once | INTRAMUSCULAR | Status: DC | PRN
  Filled 2016-08-08: qty 1

## 2016-08-08 MED ORDER — OXYCODONE-ACETAMINOPHEN 5-325 MG PO TABS: 1.0000 | ORAL_TABLET | ORAL | Status: DC | PRN

## 2016-08-08 MED ORDER — PENICILLIN G POTASSIUM 5000000 UNITS IJ SOLR
5.0000 10*6.[IU] | Freq: Once | INTRAVENOUS | Status: AC
  Administered 2016-08-08: 5 10*6.[IU] via INTRAVENOUS
  Filled 2016-08-08: qty 5

## 2016-08-08 MED ORDER — PHENYLEPHRINE 40 MCG/ML (10ML) SYRINGE FOR IV PUSH (FOR BLOOD PRESSURE SUPPORT)
PREFILLED_SYRINGE | INTRAVENOUS | Status: AC
  Filled 2016-08-08: qty 20

## 2016-08-08 MED ORDER — FLEET ENEMA 7-19 GM/118ML RE ENEM: 1.0000 | ENEMA | RECTAL | Status: DC | PRN

## 2016-08-08 MED ORDER — ACETAMINOPHEN 325 MG PO TABS: 650.0000 mg | ORAL_TABLET | ORAL | Status: DC | PRN

## 2016-08-08 MED ORDER — FENTANYL 2.5 MCG/ML BUPIVACAINE 1/10 % EPIDURAL INFUSION (WH - ANES)
INTRAMUSCULAR | Status: AC
  Filled 2016-08-08: qty 100

## 2016-08-08 MED ORDER — OXYTOCIN BOLUS FROM INFUSION
500.0000 mL | Freq: Once | INTRAVENOUS | Status: AC
  Administered 2016-08-09: 500 mL via INTRAVENOUS

## 2016-08-08 MED ORDER — MISOPROSTOL 25 MCG QUARTER TABLET
25.0000 ug | ORAL_TABLET | ORAL | Status: DC | PRN
  Administered 2016-08-08 (×2): 25 ug via VAGINAL
  Filled 2016-08-08: qty 0.25
  Filled 2016-08-08: qty 1
  Filled 2016-08-08: qty 0.25

## 2016-08-08 MED ORDER — EPHEDRINE 5 MG/ML INJ
10.0000 mg | INTRAVENOUS | Status: DC | PRN
  Filled 2016-08-08: qty 4

## 2016-08-08 MED ORDER — LACTATED RINGERS IV SOLN
INTRAVENOUS | Status: DC
  Administered 2016-08-08 (×3): via INTRAVENOUS

## 2016-08-08 MED ORDER — FENTANYL 2.5 MCG/ML BUPIVACAINE 1/10 % EPIDURAL INFUSION (WH - ANES)
14.0000 mL/h | INTRAMUSCULAR | Status: DC | PRN
  Administered 2016-08-08 (×2): 14 mL/h via EPIDURAL

## 2016-08-08 MED ORDER — OXYTOCIN 40 UNITS IN LACTATED RINGERS INFUSION - SIMPLE MED
2.5000 [IU]/h | INTRAVENOUS | Status: DC
  Filled 2016-08-08: qty 1000

## 2016-08-08 MED ORDER — OXYTOCIN 40 UNITS IN LACTATED RINGERS INFUSION - SIMPLE MED
1.0000 m[IU]/min | INTRAVENOUS | Status: DC
  Administered 2016-08-08: 2 m[IU]/min via INTRAVENOUS

## 2016-08-08 MED ORDER — ONDANSETRON HCL 4 MG/2ML IJ SOLN
4.0000 mg | Freq: Four times a day (QID) | INTRAMUSCULAR | Status: DC | PRN
  Administered 2016-08-08: 4 mg via INTRAVENOUS
  Filled 2016-08-08: qty 2

## 2016-08-08 MED ORDER — ZOLPIDEM TARTRATE 5 MG PO TABS
5.0000 mg | ORAL_TABLET | Freq: Every evening | ORAL | Status: DC | PRN
  Administered 2016-08-08: 5 mg via ORAL
  Filled 2016-08-08: qty 1

## 2016-08-08 MED ORDER — OXYCODONE-ACETAMINOPHEN 5-325 MG PO TABS: 2.0000 | ORAL_TABLET | ORAL | Status: DC | PRN

## 2016-08-08 MED ORDER — LACTATED RINGERS IV SOLN
500.0000 mL | Freq: Once | INTRAVENOUS | Status: AC
  Administered 2016-08-08: 500 mL via INTRAVENOUS

## 2016-08-08 MED ORDER — PENICILLIN G POT IN DEXTROSE 60000 UNIT/ML IV SOLN
3.0000 10*6.[IU] | INTRAVENOUS | Status: DC
  Administered 2016-08-08 – 2016-08-09 (×6): 3 10*6.[IU] via INTRAVENOUS
  Filled 2016-08-08 (×9): qty 50

## 2016-08-08 MED ORDER — SOD CITRATE-CITRIC ACID 500-334 MG/5ML PO SOLN: 30.0000 mL | ORAL | Status: DC | PRN

## 2016-08-08 NOTE — Anesthesia Preprocedure Evaluation (Signed)

## 2016-08-08 NOTE — Anesthesia Procedure Notes (Signed)
Epidural Patient location during procedure: OB  Staffing Anesthesiologist: Shianne Zeiser Performed: anesthesiologist   Preanesthetic Checklist Completed: patient identified, site marked, surgical consent, pre-op evaluation, timeout performed, IV checked, risks and benefits discussed and monitors and equipment checked  Epidural Patient position: sitting Prep: DuraPrep Patient monitoring: heart rate, continuous pulse ox and blood pressure Approach: right paramedian Location: L3-L4 Injection technique: LOR saline  Needle:  Needle type: Tuohy  Needle gauge: 17 G Needle length: 9 cm and 9 Needle insertion depth: 6 cm Catheter type: closed end flexible Catheter size: 20 Guage Catheter at skin depth: 10 cm Test dose: negative  Assessment Events: blood not aspirated, injection not painful, no injection resistance, negative IV test and no paresthesia  Additional Notes Patient identified. Risks/Benefits/Options discussed with patient including but not limited to bleeding, infection, nerve damage, paralysis, failed block, incomplete pain control, headache, blood pressure changes, nausea, vomiting, reactions to medication both or allergic, itching and postpartum back pain. Confirmed with bedside nurse the patient's most recent platelet count. Confirmed with patient that they are not currently taking any anticoagulation, have any bleeding history or any family history of bleeding disorders. Patient expressed understanding and wished to proceed. All questions were answered. Sterile technique was used throughout the entire procedure. Please see nursing notes for vital signs. Test dose was given through epidural needle and negative prior to continuing to dose epidural or start infusion. Warning signs of high block given to the patient including shortness of breath, tingling/numbness in hands, complete motor block, or any concerning symptoms with instructions to call for help. Patient was given  instructions on fall risk and not to get out of bed. All questions and concerns addressed with instructions to call with any issues.     

## 2016-08-08 NOTE — Anesthesia Pain Management Evaluation Note (Signed)
  CRNA Pain Management Visit Note  Patient: Teresa RastChristine E Steenbergen, 31 y.o., female  "Hello I am a [facility] of the anesthesia team at [facility]. We have an anesthesia team available at all times to provide care throughout the hospital, including epidural management and anesthesia for C-section. I don't know your plan for the delivery whether it a natural birth, water birth, IV sedation, nitrous supplementation, doula or epidural, but we want to meet your pain goals."   1.Was your pain managed to your expectations on prior hospitalizations?   No prior hospitalizations  2.What is your expectation for pain management during this hospitalization?     Epidural  3.How can we help you reach that goal? Pt desired labor epidural but is concerned about a herniated lumbar disc. Questions answered and pt seemed more relaxed.  Record the patient's initial score and the patient's pain goal.   Pain: 0  Pain Goal: 5 [facility] wants you to be able to say your pain was always managed very well.  Jaz Mallick 08/08/2016

## 2016-08-08 NOTE — H&P (Signed)
Teresa Graves is a 31 y.o. female presenting for IOL for AC lag.  Last U/S on 2/7 with EFW 2724g (AC lagging 2-3 weeks); nl AFI and BPP 8/8.  GBS positive. The patient received her second dose of VMP at 0600 this am.  She is feeling mild CTX and active FM.  OB History    Gravida Para Term Preterm AB Living   1         0   SAB TAB Ectopic Multiple Live Births                 Past Medical History:  Diagnosis Date  . Lumbar herniated disc    Past Surgical History:  Procedure Laterality Date  . ear lobe repaired     Family History: family history includes Cancer in her father and maternal aunt; Hyperlipidemia in her father and mother; Hypertension in her mother; Thyroid disease in her mother. Social History:  reports that she has never smoked. She has never used smokeless tobacco. She reports that she drinks alcohol. She reports that she does not use drugs.     Maternal Diabetes: No Genetic Screening: Normal Maternal Ultrasounds/Referrals: Normal Fetal Ultrasounds or other Referrals:  None Maternal Substance Abuse:  No Significant Maternal Medications:  None Significant Maternal Lab Results:  Lab values include: Group B Strep positive Other Comments:  None  ROS Maternal Medical History:  Reason for admission: Contractions.   Contractions: Onset was 6-12 hours ago.   Frequency: irregular.   Perceived severity is mild.    Fetal activity: Perceived fetal activity is normal.   Last perceived fetal movement was within the past hour.    Prenatal complications: IUGR.   Prenatal Complications - Diabetes: none.    Dilation: Fingertip Effacement (%): Thick Station: -2 Exam by:: Straight RN Blood pressure 124/73, pulse 69, temperature 97.8 F (36.6 C), temperature source Oral, resp. rate 17, height 5\' 8"  (1.727 m), weight 203 lb (92.1 kg), last menstrual period 11/08/2015. Maternal Exam:  Uterine Assessment: Contraction strength is mild.  Contraction frequency is irregular.    Abdomen: Patient reports no abdominal tenderness. Fundal height is S<D.   Estimated fetal weight is 6#4.       Physical Exam  Constitutional: She is oriented to person, place, and time. She appears well-developed and well-nourished.  GI: Soft. There is no rebound and no guarding.  Neurological: She is alert and oriented to person, place, and time.  Skin: Skin is warm and dry.    Prenatal labs: ABO, Rh: --/--/AB POS, AB POS (02/15 0115) Antibody: NEG (02/15 0115) Rubella: Immune (07/20 0000) RPR: Nonreactive (07/20 0000)  HBsAg: Negative (07/20 0000)  HIV: Non-reactive (07/20 0000)  GBS: Positive (07/20 0000)   Assessment/Plan: 31yo G1 at 3049w1d with AC lag for IOL -Recheck cvx at 1000 and pitocin vs cytotec -Epidural if desired -Anticipate NSVD -PCN for GBS ppx   Teresa Graves 08/08/2016, 8:20 AM

## 2016-08-09 ENCOUNTER — Encounter (HOSPITAL_COMMUNITY): Payer: Self-pay

## 2016-08-09 DIAGNOSIS — Z3490 Encounter for supervision of normal pregnancy, unspecified, unspecified trimester: Secondary | ICD-10-CM

## 2016-08-09 LAB — RPR: RPR: REACTIVE — AB

## 2016-08-09 LAB — CBC
HCT: 37.2 % (ref 36.0–46.0)
Hemoglobin: 12.9 g/dL (ref 12.0–15.0)
MCH: 30.9 pg (ref 26.0–34.0)
MCHC: 34.7 g/dL (ref 30.0–36.0)
MCV: 89.2 fL (ref 78.0–100.0)
PLATELETS: 166 10*3/uL (ref 150–400)
RBC: 4.17 MIL/uL (ref 3.87–5.11)
RDW: 13.1 % (ref 11.5–15.5)
WBC: 16.9 10*3/uL — ABNORMAL HIGH (ref 4.0–10.5)

## 2016-08-09 LAB — RPR, QUANT+TP ABS (REFLEX): TREPONEMA PALLIDUM AB: NEGATIVE

## 2016-08-09 MED ORDER — PRENATAL MULTIVITAMIN CH
1.0000 | ORAL_TABLET | Freq: Every day | ORAL | Status: DC
  Administered 2016-08-09 – 2016-08-10 (×2): 1 via ORAL
  Filled 2016-08-09 (×2): qty 1

## 2016-08-09 MED ORDER — OXYCODONE-ACETAMINOPHEN 5-325 MG PO TABS: 1.0000 | ORAL_TABLET | ORAL | Status: DC | PRN

## 2016-08-09 MED ORDER — IBUPROFEN 600 MG PO TABS
600.0000 mg | ORAL_TABLET | Freq: Four times a day (QID) | ORAL | Status: DC
  Administered 2016-08-09 – 2016-08-11 (×9): 600 mg via ORAL
  Filled 2016-08-09 (×10): qty 1

## 2016-08-09 MED ORDER — SIMETHICONE 80 MG PO CHEW: 80.0000 mg | CHEWABLE_TABLET | ORAL | Status: DC | PRN

## 2016-08-09 MED ORDER — DIBUCAINE 1 % RE OINT: 1.0000 "application " | TOPICAL_OINTMENT | RECTAL | Status: DC | PRN

## 2016-08-09 MED ORDER — COCONUT OIL OIL
1.0000 "application " | TOPICAL_OIL | Status: DC | PRN
  Administered 2016-08-10: 1 via TOPICAL
  Filled 2016-08-09: qty 120

## 2016-08-09 MED ORDER — SENNOSIDES-DOCUSATE SODIUM 8.6-50 MG PO TABS
2.0000 | ORAL_TABLET | ORAL | Status: DC
  Administered 2016-08-10 – 2016-08-11 (×2): 2 via ORAL
  Filled 2016-08-09 (×2): qty 2

## 2016-08-09 MED ORDER — OXYCODONE-ACETAMINOPHEN 5-325 MG PO TABS: 2.0000 | ORAL_TABLET | ORAL | Status: DC | PRN

## 2016-08-09 MED ORDER — BENZOCAINE-MENTHOL 20-0.5 % EX AERO
1.0000 "application " | INHALATION_SPRAY | CUTANEOUS | Status: DC | PRN
  Administered 2016-08-09: 1 via TOPICAL
  Filled 2016-08-09: qty 56

## 2016-08-09 MED ORDER — ONDANSETRON HCL 4 MG PO TABS: 4.0000 mg | ORAL_TABLET | ORAL | Status: DC | PRN

## 2016-08-09 MED ORDER — ONDANSETRON HCL 4 MG/2ML IJ SOLN: 4.0000 mg | INTRAMUSCULAR | Status: DC | PRN

## 2016-08-09 MED ORDER — ACETAMINOPHEN 325 MG PO TABS
650.0000 mg | ORAL_TABLET | ORAL | Status: DC | PRN
  Administered 2016-08-10 (×2): 650 mg via ORAL
  Filled 2016-08-09 (×2): qty 2

## 2016-08-09 MED ORDER — ZOLPIDEM TARTRATE 5 MG PO TABS: 5.0000 mg | ORAL_TABLET | Freq: Every evening | ORAL | Status: DC | PRN

## 2016-08-09 MED ORDER — DIPHENHYDRAMINE HCL 25 MG PO CAPS: 25.0000 mg | ORAL_CAPSULE | Freq: Four times a day (QID) | ORAL | Status: DC | PRN

## 2016-08-09 MED ORDER — TETANUS-DIPHTH-ACELL PERTUSSIS 5-2.5-18.5 LF-MCG/0.5 IM SUSP: 0.5000 mL | Freq: Once | INTRAMUSCULAR | Status: DC

## 2016-08-09 MED ORDER — WITCH HAZEL-GLYCERIN EX PADS: 1.0000 "application " | MEDICATED_PAD | CUTANEOUS | Status: DC | PRN

## 2016-08-09 NOTE — Anesthesia Postprocedure Evaluation (Signed)
Anesthesia Post Note  Patient: Teresa Graves  Procedure(s) Performed: * No procedures listed *  Patient location during evaluation: Mother Baby Anesthesia Type: Epidural Level of consciousness: awake and alert and oriented Pain management: satisfactory to patient Vital Signs Assessment: post-procedure vital signs reviewed and stable Respiratory status: spontaneous breathing and nonlabored ventilation Cardiovascular status: stable Postop Assessment: no headache, no backache, no signs of nausea or vomiting, adequate PO intake and patient able to bend at knees (patient up walking) Anesthetic complications: no        Last Vitals:  Vitals:   08/09/16 0515 08/09/16 0615  BP: 123/70 118/60  Pulse: 69 61  Resp: 18 18  Temp: 36.9 C 37.1 C    Last Pain:  Vitals:   08/09/16 0735  TempSrc:   PainSc: 5    Pain Goal: Patients Stated Pain Goal: 7 (08/08/16 1813)               Madison HickmanGREGORY,Callaway Hardigree

## 2016-08-09 NOTE — Lactation Note (Signed)
This note was copied from a baby's chart. Lactation Consultation Note; Initial visit with mom. Baby now 10 hours old. Mom reports she has had 2 good feedings so far. Reports her nurse helped her with last feeding about 1 1/2 hour ago  Baby asleep skin to skin with mom at present.  Reviewed feeding cues, normal behavior the first 24 hours and cluster feeding. BF brochure given. Reviewed our phone number, OP appointments and BFSG as resources for support after DC.  Encouraged to page for assist when baby wakes for feeding   Patient Name: Girl Lynann BeaverChristine Bowie ZOXWR'UToday's Date: 08/09/2016 Reason for consult: Initial assessment   Maternal Data Formula Feeding for Exclusion: No Has patient been taught Hand Expression?: Yes (per mom she has been taught) Does the patient have breastfeeding experience prior to this delivery?: No  Feeding Feeding Type: Breast Fed  LATCH Score/Interventions Latch: Repeated attempts needed to sustain latch, nipple held in mouth throughout feeding, stimulation needed to elicit sucking reflex. Intervention(s): Adjust position;Assist with latch;Breast massage;Breast compression  Audible Swallowing: Spontaneous and intermittent Intervention(s): Skin to skin;Alternate breast massage  Type of Nipple: Everted at rest and after stimulation  Comfort (Breast/Nipple): Soft / non-tender     Hold (Positioning): Assistance needed to correctly position infant at breast and maintain latch.  LATCH Score: 8  Lactation Tools Discussed/Used WIC Program: No   Consult Status Consult Status: Follow-up Date: 08/10/16 Follow-up type: In-patient    Pamelia HoitWeeks, Nkechi Linehan D 08/09/2016, 1:26 PM

## 2016-08-09 NOTE — Lactation Note (Signed)
This note was copied from a baby's chart. Lactation Consultation Note: mom called out for assist with latch. Assisted mom in chair in football hold. Baby sleepy- diaper changed. Mom easily able to hand express Colostrum. Baby licked it off and back to sleep. Reviewed feeding cues and encouraged to feed whenever she sees them, Dad going down to get pump from our office [facility]([employer] employee) No questions at present. To call prn  Patient Name: Teresa Lynann BeaverChristine Rings ZOXWR'UToday's Date: 08/09/2016 Reason for consult: Follow-up assessment   Maternal Data Formula Feeding for Exclusion: No Has patient been taught Hand Expression?: Yes Does the patient have breastfeeding experience prior to this delivery?: No  Feeding Feeding Type: Breast Fed Length of feed: 2 min  LATCH Score/Interventions Latch: Repeated attempts needed to sustain latch, nipple held in mouth throughout feeding, stimulation needed to elicit sucking reflex. Intervention(s): Adjust position;Assist with latch;Breast massage;Breast compression  Audible Swallowing: None Intervention(s): Skin to skin;Alternate breast massage  Type of Nipple: Everted at rest and after stimulation  Comfort (Breast/Nipple): Soft / non-tender     Hold (Positioning): Assistance needed to correctly position infant at breast and maintain latch. Intervention(s): Breastfeeding basics reviewed  LATCH Score: 6  Lactation Tools Discussed/Used WIC Program: No   Consult Status Consult Status: Follow-up Date: 08/10/16 Follow-up type: In-patient    Pamelia HoitWeeks, Karolyne Timmons D 08/09/2016, 3:26 PM

## 2016-08-09 NOTE — Plan of Care (Signed)
Problem: Nutritional: Goal: Mothers verbalization of comfort with breastfeeding process will improve Outcome: Progressing Lactation Consultant and nursing staff have provided education and assistance with mother/infant dyad with breastfeeding. Will continue to support. Mother making positive progress.

## 2016-08-09 NOTE — Progress Notes (Signed)
Post Partum Day 0 Subjective: no complaints, up ad lib, voiding and tolerating PO  Objective: Blood pressure 118/60, pulse 61, temperature 98.7 F (37.1 C), temperature source Oral, resp. rate 18, height 5\' 8"  (1.727 m), weight 203 lb (92.1 kg), last menstrual period 11/08/2015, SpO2 97 %, unknown if currently breastfeeding.  Physical Exam:  General: alert and cooperative Lochia: appropriate Uterine Fundus: firm Incision: healing well DVT Evaluation: No evidence of DVT seen on physical exam. Negative Homan's sign. No cords or calf tenderness. No significant calf/ankle edema.   Recent Labs  08/08/16 0115 08/09/16 0606  HGB 13.0 12.9  HCT 37.7 37.2    Assessment/Plan: Plan for discharge tomorrow and Breastfeeding   LOS: 1 day   Teresa Graves G 08/09/2016, 8:08 AM

## 2016-08-10 NOTE — Lactation Note (Signed)
This note was copied from a baby's chart. Lactation Consultation Note Weight loss at ~ 42 hours of age now 8.2% with baby now 5#11oz.  Baby has had 9 feedings with 6 voids and 6 stools.  LC discussed with RN, mom has been pumping some with only 1ml to supplement to baby.  Rn to offer alimentum formula syringe feedings. Lc to follow tomorrow.  Patient Name: Teresa Graves ZOXWR'UToday's Date: 08/10/2016     Maternal Data    Feeding Feeding Type: Breast Fed Length of feed: 15 min (right breast)  LATCH Score/Interventions Latch: Grasps breast easily, tongue down, lips flanged, rhythmical sucking. (enc mom to wait until she OPENS to latch) Intervention(s): Waking techniques;Skin to skin Intervention(s): Assist with latch (wait until she OPENS before latching)  Audible Swallowing: A few with stimulation Intervention(s): Skin to skin;Hand expression  Type of Nipple: Everted at rest and after stimulation  Comfort (Breast/Nipple): Filling, red/small blisters or bruises, mild/mod discomfort  Problem noted: Mild/Moderate discomfort Interventions  (Cracked/bleeding/bruising/blister): Double electric pump Interventions (Mild/moderate discomfort): Comfort gels;Post-pump (coconut oil)  Hold (Positioning): Assistance needed to correctly position infant at breast and maintain latch. Intervention(s):  (keep baby close)  [facility] Score: 7  Lactation Tools Discussed/Used     Consult Status      Teresa Graves, Arvella MerlesJana Graves 08/10/2016, 11:34 PM

## 2016-08-10 NOTE — Lactation Note (Signed)
This note was copied from a baby's chart. Lactation Consultation Note Baby lost 6% weight loss @21  hrs. Of age. Baby had poor feeding d/t sleepy and no interest in BF. Baby had great feeding this after noon. BF 30 min. Discussed w/mom cluster feeding, breast massage, and supplementing d/t less than 6 lbs and amount of weight loss. Asked RN to report to have baby re-weighed at 2000 and TCB checked so LC can discuss w/mom if baby need Alimentum supplementing as well as colostrum. Gave mom supplementation feeding sheet. At 24 hrs. Baby needs at lest 7ml colostrum. Mom hand expressed and DEBP 2 ml. Mom prefers to only give colostrum. Baby had good output 5 voids, 7 stools at 28 hrs of age.  Educated mom of comfort while BF. Mom hunched over baby while feeding, shoulder tight. Repositioned mom for comfort.  Set up mom's personal DEBP.  Baby BF well for 30 min. Mom stated it was her best feeding. Discussed newborn behavior and feeding habits.  Encouraged to give colostrum after BF.  Mom has blisters to Lt. Nipple, very tender. Comfort gels given. When baby came off of Rt. Nipple, appeared pinched. Mom didn't unlatch properly. Educated on unlatching.  Baby appears to be satisfied after feeding.  Patient Name: Teresa Graves Reason for consult: Follow-up assessment;Breast/nipple pain;Infant < 6lbs   Maternal Data    Feeding Feeding Type: Breast Fed Length of feed: 30 min  LATCH Score/Interventions Latch: Grasps breast easily, tongue down, lips flanged, rhythmical sucking. Intervention(s): Skin to skin;Teach feeding cues;Waking techniques Intervention(s): Adjust position;Breast compression;Breast massage  Audible Swallowing: Spontaneous and intermittent Intervention(s): Hand expression;Skin to skin Intervention(s): Alternate breast massage  Type of Nipple: Everted at rest and after stimulation  Comfort (Breast/Nipple): Filling, red/small blisters or bruises,  mild/mod discomfort  Problem noted: Mild/Moderate discomfort;Cracked, bleeding, blisters, bruises Interventions  (Cracked/bleeding/bruising/blister): Expressed breast milk to nipple Interventions (Mild/moderate discomfort): Comfort gels;Hand massage;Hand expression;Post-pump  Hold (Positioning): Assistance needed to correctly position infant at breast and maintain latch. Intervention(s): Breastfeeding basics reviewed;Support Pillows;Position options;Skin to skin  LATCH Score: 8  Lactation Tools Discussed/Used Tools: Pump;Comfort gels Breast pump type: Double-Electric Breast Pump Pump Review: Setup, frequency, and cleaning;Milk Storage Initiated by:: Peri JeffersonL. Laurence Crofford RN IBCLC Date initiated:: 08/10/16   Consult Status Consult Status: Follow-up Date: 08/10/16 (in pm) Follow-up type: In-patient    Charyl DancerCARVER, [name], 12:42 PM

## 2016-08-10 NOTE — Progress Notes (Signed)
Post Partum Day 1 Subjective: tolerating PO  Objective: Blood pressure 117/74, pulse (!) 56, temperature 97.7 F (36.5 C), temperature source Oral, resp. rate 17, height 5\' 8"  (1.727 m), weight 92.1 kg (203 lb), last menstrual period 11/08/2015, SpO2 97 %, unknown if currently breastfeeding.  Physical Exam:  General: alert Lochia: appropriate Uterine Fundus: firm Incision: healing well DVT Evaluation: No evidence of DVT seen on physical exam.   Recent Labs  08/08/16 0115 08/09/16 0606  HGB 13.0 12.9  HCT 37.7 37.2    Assessment/Plan: Plan for discharge tomorrow   LOS: 2 days   Tifanie Gardiner M 08/10/2016, 8:33 AM

## 2016-08-11 MED ORDER — IBUPROFEN 600 MG PO TABS: 600.0000 mg | ORAL_TABLET | Freq: Four times a day (QID) | ORAL | 0 refills | Status: DC

## 2016-08-11 NOTE — Discharge Summary (Signed)
Obstetric Discharge Summary Reason for Admission: induction of labor Prenatal Procedures: none Intrapartum Procedures: spontaneous vaginal delivery Postpartum Procedures: none Complications-Operative and Postpartum: none Hemoglobin  Date Value Ref Range Status  08/09/2016 12.9 12.0 - 15.0 g/dL Final   HCT  Date Value Ref Range Status  08/09/2016 37.2 36.0 - 46.0 % Final    Physical Exam:  General: alert Lochia: appropriate Uterine Fundus: firm Incision: healing well DVT Evaluation: No evidence of DVT seen on physical exam.  Discharge Diagnoses: Term Pregnancy-delivered  Discharge Information: Date: 08/11/2016 Activity: pelvic rest Diet: routine Medications: PNV and Ibuprofen Condition: stable Instructions: refer to practice specific booklet Discharge to: home Follow-up Information    [facility]. Schedule an appointment as soon as possible for a visit in 6 week(s).   Contact information: [address] [place] [id number] [phone]           Newborn Data: Live born female  Birth Weight: 6 lb 3.3 oz (2815 g) APGAR: 7, 9  Home with mother.  Meriel PicaHOLLAND,Lashon Hillier M 08/11/2016, 8:12 AM

## 2016-08-11 NOTE — Lactation Note (Signed)
This note was copied from a baby's chart. Lactation Consultation Note   I came in at end of feeding. Mom reports some pain with initial latch but usually eases off. Nipples tender- intact but pink. Supplementing with formula after nuesing due to weight loss. Finger feeding with curved tip syringe. Offered 5 Fr feeding tube/syringe to supplement while at the breast. They want dad to supplement so will use it after nursing. Reviewed setup, use and cleaning of pieces. Baby took 20 ml and off to sleep. Mom going to eat breakfast, then pump. Offered OP appointment but mom wants to go home and see how things go. Encouragement given. No questions at present. To call prn  Patient Name: Girl Lynann BeaverChristine Lipinski ZOXWR'UToday's Date: 08/11/2016 Reason for consult: Follow-up assessment   Maternal Data Formula Feeding for Exclusion: No Has patient been taught Hand Expression?: Yes Does the patient have breastfeeding experience prior to this delivery?: No  Feeding Feeding Type: Breast Fed Length of feed: 20 min  LATCH Score/Interventions Latch: Grasps breast easily, tongue down, lips flanged, rhythmical sucking.  Audible Swallowing: None  Type of Nipple: Everted at rest and after stimulation  Comfort (Breast/Nipple): Filling, red/small blisters or bruises, mild/mod discomfort  Problem noted: Mild/Moderate discomfort Interventions (Mild/moderate discomfort): Comfort gels  Hold (Positioning): No assistance needed to correctly position infant at breast. Intervention(s): Breastfeeding basics reviewed  LATCH Score: 7  Lactation Tools Discussed/Used Tools: 56F feeding tube / Syringe Breast pump type: Double-Electric Breast Pump WIC Program: No   Consult Status Consult Status: Complete    Pamelia HoitWeeks, Zionah Criswell D 08/11/2016, 9:52 AM

## 2016-08-21 ENCOUNTER — Ambulatory Visit: Payer: Self-pay

## 2016-08-21 NOTE — Lactation Note (Signed)
This note was copied from a baby's chart. Lactation Consult  Mother's reason for visit:   Mother has sore nipples and reports that latch has been difficult since birth.  Mother reports that Teresa Graves has been a poor feeder due to sleepiness.   Visit Type: Feeding assessment   Consult:  Initial Lactation Consultant:  Michel Bickers  ________________________________________________________________________    ________________________________________________________________________  Mother's Name: Teresa Graves Type of delivery:  Vaginal, Spontaneous Delivery Breastfeeding Experience:  none Maternal Medical Conditions:  none Maternal Medications: pre natal vit, tylenol, ibuprophen   ________________________________________________________________________  Breastfeeding History (Post Discharge)  Frequency of breastfeeding: every 2-3 hours  Duration of feeding: 10-30 mins  Supplementation   Breastmilk:  Volume 30  ml Frequency:  Every 2-3 hours   Method:  Fingerfeeding,   Pumping  Type of pump:  Medela pump in style Frequency:  Every 2 hours Volume: 1-2 ounces   Infant Intake and Output Assessment  Voids: 9-12  in 24 hrs.  Color:  Clear yellow Stools: 3-4   in 24 hrs.  Color:  Yellow  ________________________________________________________________________  Maternal Breast Assessment  Breast:  Full Nipple:  Flat Pain level:  4 Pain interventions:  Bra  _______________________________________________________________________ Feeding Assessment/Evaluation:  Mother has very pink tip of her left nipple. Rt nipples is slightly pink. No observed cracking. She states that pumping cause's pain . She reports that she has her pump on the lowest setting. Mothers breast are full.   Initial feeding assessment: Mother latched infant on quickly before infant could open wide.  Mother complained of a #4 pain scale. Slight tug on infants lower jaw for wider gape and   Rolled top lip upward. Mother reports that latch still felt the same.  Observed nipple compression on the side of her nipple.   Infant fed for 15-20 mins .  Infant transferred 22 ml Mother advised to do breast compression frequently.   Infant's oral assessment:  Variance, slight tight anterior frenula  Positioning:  Football Left breast  LATCH documentation:  Latch:  2 = Grasps breast easily, tongue down, lips flanged, rhythmical sucking.  Audible swallowing:  2 = Spontaneous and intermittent  Type of nipple:  2 = Everted at rest and after stimulation  Comfort (Breast/Nipple):  1 = Filling, red/small blisters or bruises, mild/mod discomfort  Hold (Positioning):  1 = Assistance needed to correctly position infant at breast and maintain latch  LATCH score:  8  Attached assessment:  Deep  Lips flanged:  Yes.    Lips untucked:  Yes.    Suck assessment:  Displays both  Tools:  Curved tip syringe and Lanolin Instructed on use and cleaning of tool:  Yes.    Pre-feed weight:  2696 g 5-15.1 Post-feed weight:  2718 g 5-15.9 Amount transferred:  22 ml   Additional Feeding Assessment -  Mother taught nipple to nose latch technique.  Infant latched on with a wider gape and deeper latch. Lips well flanged.  Observed nipple compression with nipple being long. Infant transferred 16 ml.   Infant's oral assessment:  Variance  Positioning:  Cross cradle Right breast  LATCH documentation:  Latch:  2 = Grasps breast easily, tongue down, lips flanged, rhythmical sucking.  Audible swallowing:  2 = Spontaneous and intermittent  Type of nipple:  2 = Everted at rest and after stimulation  Comfort (Breast/Nipple):  1 = Filling, red/small blisters or bruises, mild/mod discomfort  Hold (Positioning):  1 = Assistance needed to correctly position infant at  breast and maintain latch  LATCH score:    Attached assessment:  Deep  Lips flanged:  Yes.    Lips untucked:  Yes.    Suck assessment:   Displays both  Tools:  Curved tip syringe Instructed on use and cleaning of tool:  Yes.    Pre-feed weight: 2718  g  5-15.9 Post-feed weight:  2734 g  6-0.5 Amount transferred:  16 ml Amount supplemented:  20 ml     Total amount transferred:  38 ml Total supplement given: 20  Ml  Advised mother to wear shells for soreness (given to mother on visit) Alternate with comfort gels.  Suggested that she get RX for APNO from her OB. Continue to breastfeed infant every 3 hours Make sure that Teresa Poagmelia is awakened well. Advised to breastfeed well on one breast And then offer alternate breast. Sometimes this wakes infant Burp infant well.  Supplement infant with EBM give 30- 45 ml if infant will take Mother to post pump for 15 mins every 2-3 hours or 6 times daily Follow up in one week to Peds for [facility] check [facility]

## 2016-09-25 DIAGNOSIS — Z1389 Encounter for screening for other disorder: Secondary | ICD-10-CM | POA: Diagnosis not present

## 2016-10-08 DIAGNOSIS — Z30430 Encounter for insertion of intrauterine contraceptive device: Secondary | ICD-10-CM | POA: Diagnosis not present

## 2016-11-25 DIAGNOSIS — Z30431 Encounter for routine checking of intrauterine contraceptive device: Secondary | ICD-10-CM | POA: Diagnosis not present

## 2017-06-11 ENCOUNTER — Telehealth: Payer: 59 | Admitting: Nurse Practitioner

## 2017-06-11 DIAGNOSIS — J0100 Acute maxillary sinusitis, unspecified: Secondary | ICD-10-CM | POA: Diagnosis not present

## 2017-06-11 MED ORDER — AMOXICILLIN-POT CLAVULANATE 875-125 MG PO TABS: 1.0000 | ORAL_TABLET | Freq: Two times a day (BID) | ORAL | 0 refills | Status: DC

## 2017-06-11 MED FILL — AMOX TR-K CLV 875-125 MG TA: 875-125 | 7 days supply | Qty: 14 | Fill #0

## 2017-06-11 NOTE — Progress Notes (Signed)

## 2017-11-12 ENCOUNTER — Encounter: Payer: Self-pay | Admitting: General Practice

## 2017-11-27 ENCOUNTER — Encounter

## 2017-11-27 ENCOUNTER — Other Ambulatory Visit: Payer: Self-pay

## 2017-11-27 ENCOUNTER — Encounter: Payer: Self-pay | Admitting: Family Medicine

## 2017-11-27 ENCOUNTER — Ambulatory Visit (INDEPENDENT_AMBULATORY_CARE_PROVIDER_SITE_OTHER): Payer: No Typology Code available for payment source | Admitting: Family Medicine

## 2017-11-27 VITALS — BP 100/64 | HR 67 | Temp 98.5°F | Resp 15 | Ht 67.25 in | Wt 173.8 lb

## 2017-11-27 DIAGNOSIS — Z0000 Encounter for general adult medical examination without abnormal findings: Secondary | ICD-10-CM | POA: Diagnosis not present

## 2017-11-27 DIAGNOSIS — E663 Overweight: Secondary | ICD-10-CM | POA: Diagnosis not present

## 2017-11-27 NOTE — Progress Notes (Signed)
   Subjective:    Patient ID: Winfield RastChristine E Volkert, female    DOB: [birth year], 32 y.o.   MRN: [id number]  HPI CPE- UTD on pap Renaldo Fiddler([facility]), UTD on flu, Tdap.   Review of Systems Patient reports no vision/ hearing changes, adenopathy,fever, weight change,  persistant/recurrent hoarseness , swallowing issues, chest pain, palpitations, edema, persistant/recurrent cough, hemoptysis, dyspnea (rest/exertional/paroxysmal nocturnal), gastrointestinal bleeding (melena, rectal bleeding), abdominal pain, significant heartburn, bowel changes, GU symptoms (dysuria, hematuria, incontinence), Gyn symptoms (abnormal  bleeding, pain),  syncope, focal weakness, memory loss, numbness & tingling, skin/hair/nail changes, abnormal bruising or bleeding, anxiety, or depression.     Objective:   Physical Exam General Appearance:    Alert, cooperative, no distress, appears stated age  Head:    Normocephalic, without obvious abnormality, atraumatic  Eyes:    PERRL, conjunctiva/corneas clear, EOM's intact, fundi    benign, both eyes  Ears:    Normal TM's and external ear canals, both ears  Nose:   Nares normal, septum midline, mucosa normal, no drainage    or sinus tenderness  Throat:   Lips, mucosa, and tongue normal; teeth and gums normal  Neck:   Supple, symmetrical, trachea midline, no adenopathy;    Thyroid: no enlargement/tenderness/nodules  Back:     Symmetric, no curvature, ROM normal, no CVA tenderness  Lungs:     Clear to auscultation bilaterally, respirations unlabored  Chest Wall:    No tenderness or deformity   Heart:    Regular rate and rhythm, S1 and S2 normal, no murmur, rub   or gallop  Breast Exam:    Deferred to GYN  Abdomen:     Soft, non-tender, bowel sounds active all four quadrants,    no masses, no organomegaly  Genitalia:    Deferred to GYN  Rectal:    Extremities:   Extremities normal, atraumatic, no cyanosis or edema  Pulses:   2+ and symmetric all extremities  Skin:   Skin color, texture,  turgor normal, no rashes or lesions  Lymph nodes:   Cervical, supraclavicular, and axillary nodes normal  Neurologic:   CNII-XII intact, normal strength, sensation and reflexes    throughout          Assessment & Plan:

## 2017-11-27 NOTE — Addendum Note (Signed)
Addended by: Marcille BlancoMCNEILL, Franz Svec on: 11/27/2017 04:41 PM   Modules accepted: Orders

## 2017-11-27 NOTE — Assessment & Plan Note (Signed)
Pt's PE WNL.  UTD on immunizations, GYN.  Check labs.  Anticipatory guidance provided.  

## 2017-11-27 NOTE — Addendum Note (Signed)
Addended by: Geannie RisenBRODMERKEL, Keiasha Diep L on: 11/27/2017 04:38 PM   Modules accepted: Orders

## 2017-11-27 NOTE — Patient Instructions (Signed)
Follow up in 1 year or as needed We'll notify you of your lab results and make any changes if needed Continue to work on healthy diet and regular exercise- you look great! Call with any questions or concerns Have a great summer!! 

## 2017-11-28 ENCOUNTER — Other Ambulatory Visit: Payer: Self-pay | Admitting: General Practice

## 2017-11-28 LAB — BASIC METABOLIC PANEL
BUN: 13 mg/dL (ref 7–25)
CALCIUM: 9.6 mg/dL (ref 8.6–10.2)
CO2: 25 mmol/L (ref 20–32)
Chloride: 103 mmol/L (ref 98–110)
Creat: 0.8 mg/dL (ref 0.50–1.10)
GLUCOSE: 83 mg/dL (ref 65–99)
Potassium: 4.2 mmol/L (ref 3.5–5.3)
Sodium: 139 mmol/L (ref 135–146)

## 2017-11-28 LAB — HEPATIC FUNCTION PANEL
AG RATIO: 1.7 (calc) (ref 1.0–2.5)
ALBUMIN MSPROF: 4.7 g/dL (ref 3.6–5.1)
ALT: 33 U/L — ABNORMAL HIGH (ref 6–29)
AST: 37 U/L — ABNORMAL HIGH (ref 10–30)
Alkaline phosphatase (APISO): 68 U/L (ref 33–115)
BILIRUBIN DIRECT: 0.1 mg/dL (ref 0.0–0.2)
BILIRUBIN INDIRECT: 0.5 mg/dL (ref 0.2–1.2)
GLOBULIN: 2.7 g/dL (ref 1.9–3.7)
Total Bilirubin: 0.6 mg/dL (ref 0.2–1.2)
Total Protein: 7.4 g/dL (ref 6.1–8.1)

## 2017-11-28 LAB — LIPID PANEL
CHOL/HDL RATIO: 3.8 (calc) (ref <5.0)
CHOLESTEROL: 217 mg/dL — AB (ref <200)
HDL: 57 mg/dL (ref >50)
LDL CHOLESTEROL (CALC): 127 mg/dL — AB
Non-HDL Cholesterol (Calc): 160 mg/dL (calc) — ABNORMAL HIGH (ref <130)
Triglycerides: 189 mg/dL — ABNORMAL HIGH (ref <150)

## 2017-11-28 LAB — CBC WITH DIFFERENTIAL/PLATELET
BASOS PCT: 0.5 %
Basophils Absolute: 48 cells/uL (ref 0–200)
EOS PCT: 1.8 %
Eosinophils Absolute: 173 cells/uL (ref 15–500)
HCT: 41.5 % (ref 35.0–45.0)
HEMOGLOBIN: 14.2 g/dL (ref 11.7–15.5)
Lymphs Abs: 2179 cells/uL (ref 850–3900)
MCH: 30.2 pg (ref 27.0–33.0)
MCHC: 34.2 g/dL (ref 32.0–36.0)
MCV: 88.3 fL (ref 80.0–100.0)
MONOS PCT: 5.5 %
MPV: 10.5 fL (ref 7.5–12.5)
NEUTROS ABS: 6672 {cells}/uL (ref 1500–7800)
Neutrophils Relative %: 69.5 %
PLATELETS: 225 10*3/uL (ref 140–400)
RBC: 4.7 10*6/uL (ref 3.80–5.10)
RDW: 12.4 % (ref 11.0–15.0)
Total Lymphocyte: 22.7 %
WBC mixed population: 528 cells/uL (ref 200–950)
WBC: 9.6 10*3/uL (ref 3.8–10.8)

## 2017-11-28 LAB — VITAMIN D 25 HYDROXY (VIT D DEFICIENCY, FRACTURES): Vit D, 25-Hydroxy: 21 ng/mL — ABNORMAL LOW (ref 30–100)

## 2017-11-28 LAB — TSH: TSH: 1.83 mIU/L

## 2017-11-28 MED ORDER — VITAMIN D (ERGOCALCIFEROL) 1.25 MG (50000 UNIT) PO CAPS: 50000.0000 [IU] | ORAL_CAPSULE | ORAL | 0 refills | Status: DC

## 2017-11-28 MED FILL — VIT D2 1.25 MG (50,000 UNIT: 1.25 MG | 84 days supply | Qty: 12 | Fill #0

## 2018-07-14 MED FILL — predniSONE 50 MG TABS: 50 | 2 days supply | Qty: 2 | Fill #0

## 2018-07-15 ENCOUNTER — Encounter: Payer: Self-pay | Admitting: Family Medicine

## 2018-07-15 ENCOUNTER — Ambulatory Visit (INDEPENDENT_AMBULATORY_CARE_PROVIDER_SITE_OTHER): Payer: No Typology Code available for payment source | Admitting: Family Medicine

## 2018-07-15 ENCOUNTER — Other Ambulatory Visit: Payer: Self-pay

## 2018-07-15 VITALS — BP 90/50 | HR 62 | Temp 98.2°F | Resp 14 | Ht 67.0 in | Wt 175.0 lb

## 2018-07-15 DIAGNOSIS — M545 Low back pain, unspecified: Secondary | ICD-10-CM

## 2018-07-15 MED ORDER — CYCLOBENZAPRINE HCL 10 MG PO TABS: 10.0000 mg | ORAL_TABLET | Freq: Three times a day (TID) | ORAL | 0 refills | Status: DC | PRN

## 2018-07-15 MED ORDER — PREDNISONE 10 MG PO TABS: ORAL_TABLET | ORAL | 0 refills | Status: DC

## 2018-07-15 MED FILL — CYCLOBENZAPRINE HCL 10 MG T: 10 | 15 days supply | Qty: 45 | Fill #0

## 2018-07-15 MED FILL — predniSONE 10 MG TABS: 10 | 9 days supply | Qty: 18 | Fill #0

## 2018-07-15 NOTE — Assessment & Plan Note (Signed)
Deteriorated.  No known injury but pt has hx of similar.  No known trigger but she does have a toddler that she picks up regularly.  Since she has already started Prednisone, will finish a taper.  Flexeril prn.  Reviewed supportive care and red flags that should prompt return.  Pt expressed understanding and is in agreement w/ plan.

## 2018-07-15 NOTE — Patient Instructions (Signed)
Follow up as needed or as scheduled Start the Prednisone as directed- 3 tabs at the same time x3 days, and then 2 tabs at the same time x3 days, and then 1 tab daily Start the Flexeril nightly- start w/ 1/2 tab and increase to 1 tab if needed HEAT! Call with any questions or concerns Hang in there!!!

## 2018-07-15 NOTE — Progress Notes (Signed)
   Subjective:    Patient ID: Teresa Graves, female    DOB: [birth year], 33 y.o.   MRN: [id number]  HPI Back pain- pt has hx of this.  Worsened suddenly on Sunday.  No known injury.  Using Tylenol, ice/heat, muscle rub cream w/o relief.  Attempting to avoid NSAIDs b/c trying for baby #2.  Did Evisit yesterday- took Prednisone 50mg  yesterday and today w/ some relief.  Pain is central, lumbar.  No radiation of pain.   Review of Systems For ROS see HPI     Objective:   Physical Exam Vitals signs reviewed.  Constitutional:      Appearance: She is normal weight.     Comments: Obviously uncomfortable  HENT:     Head: Normocephalic and atraumatic.  Musculoskeletal:     Comments: No TTP over spine, mild TTP over tight lumbar paraspinal muscles Pain w/ both forward flexion and extension  Neurological:     General: No focal deficit present.     Mental Status: She is alert and oriented to person, place, and time.     Motor: No weakness.     Gait: Gait abnormal (stiff, antalgic gait).     Deep Tendon Reflexes: Reflexes normal.           Assessment & Plan:

## 2018-11-30 ENCOUNTER — Encounter: Payer: No Typology Code available for payment source | Admitting: Family Medicine

## 2018-12-01 ENCOUNTER — Encounter: Payer: Self-pay | Admitting: Family Medicine

## 2018-12-01 ENCOUNTER — Ambulatory Visit (INDEPENDENT_AMBULATORY_CARE_PROVIDER_SITE_OTHER): Payer: No Typology Code available for payment source | Admitting: Family Medicine

## 2018-12-01 ENCOUNTER — Other Ambulatory Visit: Payer: Self-pay

## 2018-12-01 VITALS — BP 96/70 | HR 56 | Temp 98.0°F | Resp 17 | Ht 68.0 in | Wt 175.0 lb

## 2018-12-01 DIAGNOSIS — Z0000 Encounter for general adult medical examination without abnormal findings: Secondary | ICD-10-CM

## 2018-12-01 DIAGNOSIS — E559 Vitamin D deficiency, unspecified: Secondary | ICD-10-CM | POA: Diagnosis not present

## 2018-12-01 DIAGNOSIS — E785 Hyperlipidemia, unspecified: Secondary | ICD-10-CM

## 2018-12-01 LAB — BASIC METABOLIC PANEL
BUN: 11 mg/dL (ref 6–23)
CO2: 26 mEq/L (ref 19–32)
Calcium: 9.7 mg/dL (ref 8.4–10.5)
Chloride: 105 mEq/L (ref 96–112)
Creatinine, Ser: 0.69 mg/dL (ref 0.40–1.20)
GFR: 96.6 mL/min (ref >60.00)
Glucose, Bld: 80 mg/dL (ref 70–99)
Potassium: 3.8 mEq/L (ref 3.5–5.1)
Sodium: 140 mEq/L (ref 135–145)

## 2018-12-01 LAB — LIPID PANEL
Cholesterol: 192 mg/dL (ref 0–200)
HDL: 62.7 mg/dL (ref >39.00)
LDL Cholesterol: 112 mg/dL — ABNORMAL HIGH (ref 0–99)
NonHDL: 128.98
Total CHOL/HDL Ratio: 3
Triglycerides: 83 mg/dL (ref 0.0–149.0)
VLDL: 16.6 mg/dL (ref 0.0–40.0)

## 2018-12-01 LAB — CBC WITH DIFFERENTIAL/PLATELET
Basophils Absolute: 0 10*3/uL (ref 0.0–0.1)
Basophils Relative: 0.4 % (ref 0.0–3.0)
Eosinophils Absolute: 0.1 10*3/uL (ref 0.0–0.7)
Eosinophils Relative: 0.8 % (ref 0.0–5.0)
HCT: 38.9 % (ref 36.0–46.0)
Hemoglobin: 12.9 g/dL (ref 12.0–15.0)
Lymphocytes Relative: 22.2 % (ref 12.0–46.0)
Lymphs Abs: 1.6 10*3/uL (ref 0.7–4.0)
MCHC: 33.1 g/dL (ref 30.0–36.0)
MCV: 92.8 fl (ref 78.0–100.0)
Monocytes Absolute: 0.4 10*3/uL (ref 0.1–1.0)
Monocytes Relative: 6.1 % (ref 3.0–12.0)
Neutro Abs: 5 10*3/uL (ref 1.4–7.7)
Neutrophils Relative %: 70.5 % (ref 43.0–77.0)
Platelets: 218 10*3/uL (ref 150.0–400.0)
RBC: 4.19 Mil/uL (ref 3.87–5.11)
RDW: 13.8 % (ref 11.5–15.5)
WBC: 7.1 10*3/uL (ref 4.0–10.5)

## 2018-12-01 LAB — HEPATIC FUNCTION PANEL
ALT: 13 U/L (ref 0–35)
AST: 21 U/L (ref 0–37)
Albumin: 4.7 g/dL (ref 3.5–5.2)
Alkaline Phosphatase: 52 U/L (ref 39–117)
Bilirubin, Direct: 0.2 mg/dL (ref 0.0–0.3)
Total Bilirubin: 1 mg/dL (ref 0.2–1.2)
Total Protein: 7.1 g/dL (ref 6.0–8.3)

## 2018-12-01 LAB — VITAMIN D 25 HYDROXY (VIT D DEFICIENCY, FRACTURES): VITD: 26.38 ng/mL — ABNORMAL LOW (ref 30.00–100.00)

## 2018-12-01 LAB — TSH: TSH: 1.67 u[IU]/mL (ref 0.35–4.50)

## 2018-12-01 NOTE — Patient Instructions (Signed)
Follow up in 1 year or as needed We'll notify you of your lab results and make any changes if needed Continue to work on healthy diet and regular exercise- you can do it!! Call with any questions or concerns Stay Safe!!! 

## 2018-12-01 NOTE — Assessment & Plan Note (Signed)
Pt has hx of this.  Has been controlled w/ diet and exercise.  Check labs and determine if tx needed.

## 2018-12-01 NOTE — Assessment & Plan Note (Signed)
Pt's PE WNL.  Due for pap- pt to call and schedule.  Check labs.  Anticipatory guidance provided.

## 2018-12-01 NOTE — Progress Notes (Signed)
   Subjective:    Patient ID: Teresa Graves, female    DOB: 08/28/1985, 33 y.o.   MRN: [id number]  HPI CPE- due for pap (sees [facility]).  UTD on immunizations.  No concerns today.   Review of Systems Patient reports no vision/ hearing changes, adenopathy,fever, weight change,  persistant/recurrent hoarseness , swallowing issues, chest pain, palpitations, edema, persistant/recurrent cough, hemoptysis, dyspnea (rest/exertional/paroxysmal nocturnal), gastrointestinal bleeding (melena, rectal bleeding), abdominal pain, significant heartburn, bowel changes, GU symptoms (dysuria, hematuria, incontinence), Gyn symptoms (abnormal  bleeding, pain),  syncope, focal weakness, memory loss, numbness & tingling, skin/hair/nail changes, abnormal bruising or bleeding, anxiety, or depression.     Objective:   Physical Exam General Appearance:    Alert, cooperative, no distress, appears stated age  Head:    Normocephalic, without obvious abnormality, atraumatic  Eyes:    PERRL, conjunctiva/corneas clear, EOM's intact, fundi    benign, both eyes  Ears:    Normal TM's and external ear canals, both ears  Nose:   Nares normal, septum midline, mucosa normal, no drainage    or sinus tenderness  Throat:   Lips, mucosa, and tongue normal; teeth and gums normal  Neck:   Supple, symmetrical, trachea midline, no adenopathy;    Thyroid: no enlargement/tenderness/nodules  Back:     Symmetric, no curvature, ROM normal, no CVA tenderness  Lungs:     Clear to auscultation bilaterally, respirations unlabored  Chest Wall:    No tenderness or deformity   Heart:    Regular rate and rhythm, S1 and S2 normal, no murmur, rub   or gallop  Breast Exam:    Deferred to GYN  Abdomen:     Soft, non-tender, bowel sounds active all four quadrants,    no masses, no organomegaly  Genitalia:    Deferred to GYN  Rectal:    Extremities:   Extremities normal, atraumatic, no cyanosis or edema  Pulses:   2+ and symmetric all extremities   Skin:   Skin color, texture, turgor normal, no rashes or lesions  Lymph nodes:   Cervical, supraclavicular, and axillary nodes normal  Neurologic:   CNII-XII intact, normal strength, sensation and reflexes    throughout          Assessment & Plan:

## 2018-12-02 ENCOUNTER — Encounter: Payer: Self-pay | Admitting: Family Medicine

## 2018-12-02 ENCOUNTER — Telehealth: Payer: Self-pay | Admitting: General Practice

## 2018-12-02 ENCOUNTER — Other Ambulatory Visit: Payer: No Typology Code available for payment source

## 2018-12-02 DIAGNOSIS — Z20822 Contact with and (suspected) exposure to covid-19: Secondary | ICD-10-CM

## 2018-12-02 NOTE — Addendum Note (Signed)
Addended by: Denman George on: 12/02/2018 09:51 AM   Modules accepted: Orders

## 2018-12-02 NOTE — Telephone Encounter (Signed)
Pt has been scheduled for covid-19 testing.   Scheduled with pt directly   Pt was referred by: Annye Asa MD.

## 2018-12-03 ENCOUNTER — Other Ambulatory Visit: Payer: Self-pay | Admitting: Family Medicine

## 2018-12-03 LAB — NOVEL CORONAVIRUS, NAA: SARS-CoV-2, NAA: NOT DETECTED

## 2018-12-03 MED ORDER — VITAMIN D (ERGOCALCIFEROL) 1.25 MG (50000 UNIT) PO CAPS: 50000.0000 [IU] | ORAL_CAPSULE | ORAL | 0 refills | Status: DC

## 2018-12-04 ENCOUNTER — Telehealth: Payer: Self-pay | Admitting: Family Medicine

## 2018-12-04 NOTE — Telephone Encounter (Signed)
Called and advised pt of PCP answers to her questions. Documented in lab results.

## 2018-12-04 NOTE — Telephone Encounter (Signed)
Pt called back with questions regarding her COVID test and would like a call back,

## 2018-12-08 MED ORDER — AMOXICILLIN 875 MG PO TABS: 875.0000 mg | ORAL_TABLET | Freq: Two times a day (BID) | ORAL | 0 refills | Status: DC

## 2018-12-08 MED FILL — AMOXICILLIN 875 MG TABS: 875 | 10 days supply | Qty: 20 | Fill #0

## 2019-01-08 ENCOUNTER — Encounter: Payer: Self-pay | Admitting: Family Medicine

## 2019-04-06 ENCOUNTER — Encounter: Payer: Self-pay | Admitting: Family Medicine

## 2019-04-06 ENCOUNTER — Other Ambulatory Visit: Payer: Self-pay

## 2019-04-06 ENCOUNTER — Ambulatory Visit (INDEPENDENT_AMBULATORY_CARE_PROVIDER_SITE_OTHER): Payer: No Typology Code available for payment source | Admitting: Family Medicine

## 2019-04-06 VITALS — BP 102/68 | HR 66 | Temp 97.8°F | Resp 16 | Ht 68.0 in | Wt 179.5 lb

## 2019-04-06 DIAGNOSIS — Z3201 Encounter for pregnancy test, result positive: Secondary | ICD-10-CM | POA: Diagnosis not present

## 2019-04-06 DIAGNOSIS — Z3200 Encounter for pregnancy test, result unknown: Secondary | ICD-10-CM

## 2019-04-06 LAB — POCT URINE PREGNANCY: Preg Test, Ur: POSITIVE — AB

## 2019-04-06 NOTE — Progress Notes (Signed)
   Subjective:    Patient ID: Teresa Graves, female    DOB: [birth year], 33 y.o.   MRN: [id number]  HPI Possible pregnancy- pt took home test recently and had a very faint line.  LMP- 8/8.  Tested in September when she missed her period- test was negative.  Pt reports sxs in the last 2 weeks- fatigue, nausea.  No breast tenderness.  No spotting.  Pt is interested in switching OBs.  On PNV.y   Review of Systems For ROS see HPI     Objective:   Physical Exam Vitals signs reviewed.  Constitutional:      General: She is not in acute distress.    Appearance: Normal appearance. She is not ill-appearing.  HENT:     Head: Normocephalic and atraumatic.  Neurological:     General: No focal deficit present.     Mental Status: She is alert and oriented to person, place, and time.  Psychiatric:        Mood and Affect: Mood normal.        Behavior: Behavior normal.        Thought Content: Thought content normal.           Assessment & Plan:  Possible pregnancy- pregnancy test was immediately positive in office.  Pt is on prenatal vitamins.  Would like to switch OB offices.  Referral placed.  She is very excited about this.

## 2019-04-06 NOTE — Patient Instructions (Signed)
We'll call you with your OB appt Continue your prenatal vitamins daily CONGRATS!!!!

## 2019-04-07 ENCOUNTER — Ambulatory Visit: Payer: No Typology Code available for payment source | Admitting: Family Medicine

## 2019-05-04 LAB — OB RESULTS CONSOLE GC/CHLAMYDIA
Chlamydia: NEGATIVE
Gonorrhea: NEGATIVE

## 2019-05-04 LAB — OB RESULTS CONSOLE HIV ANTIBODY (ROUTINE TESTING): HIV: NONREACTIVE

## 2019-05-04 LAB — OB RESULTS CONSOLE ABO/RH: RH Type: POSITIVE

## 2019-05-04 LAB — OB RESULTS CONSOLE RPR: RPR: NONREACTIVE

## 2019-05-04 LAB — OB RESULTS CONSOLE RUBELLA ANTIBODY, IGM: Rubella: IMMUNE

## 2019-05-04 LAB — OB RESULTS CONSOLE ANTIBODY SCREEN: Antibody Screen: NEGATIVE

## 2019-05-04 LAB — OB RESULTS CONSOLE HEPATITIS B SURFACE ANTIGEN: Hepatitis B Surface Ag: NEGATIVE

## 2019-05-05 LAB — HM PAP SMEAR

## 2019-06-03 ENCOUNTER — Encounter: Payer: Self-pay | Admitting: Physical Therapy

## 2019-06-03 ENCOUNTER — Other Ambulatory Visit: Payer: Self-pay

## 2019-06-03 ENCOUNTER — Ambulatory Visit
Payer: No Typology Code available for payment source | Attending: Obstetrics and Gynecology | Admitting: Physical Therapy

## 2019-06-03 DIAGNOSIS — R293 Abnormal posture: Secondary | ICD-10-CM | POA: Insufficient documentation

## 2019-06-03 DIAGNOSIS — M6281 Muscle weakness (generalized): Secondary | ICD-10-CM | POA: Diagnosis present

## 2019-06-03 DIAGNOSIS — M545 Low back pain, unspecified: Secondary | ICD-10-CM

## 2019-06-03 NOTE — Therapy (Signed)
[facility] [address], [address] [place], [place], [address] Phone: [phone]   Fax:  [phone]  Physical Therapy Evaluation  Patient Details  Name: Teresa Graves MRN: [id number] Date of Birth: 07/04/1985 Referring Provider (PT): Jody Bovard-Stuckert   Encounter Date: 06/03/2019  PT End of Session - 06/03/19 1545    Visit Number  1    Date for PT Re-Evaluation  08/13/19    Authorization Type  [employer]    Authorization Time Period  06/03/19 to 08/13/19    PT Start Time  1449    PT Stop Time  1530    PT Time Calculation (min)  41 min    Activity Tolerance  No increased pain;Patient tolerated treatment well    Behavior During Therapy  [facility] for tasks assessed/performed       Past Medical History:  Diagnosis Date  . Lumbar herniated disc     Past Surgical History:  Procedure Laterality Date  . ear lobe repaired      There were no vitals filed for this visit.   Subjective Assessment - 06/03/19 1454    Subjective  Pt states that she had history of low back pain and Rt radicular symptoms around 4 years ago. She had success with PT in the past. She has since had 1 child and is currently [redacted] weeks pregnant. She has noticed this past year a worsening in her back pain especially with lifting her toddler. She is uncomfortable sleeping on her back and bending/stooping.    Pertinent History  lumbar herniated disc    Limitations  Lifting    Patient Stated Goals  improve her pain and strength throughout pregnancy    Currently in Pain?  Yes    Pain Score  3     Pain Location  Back    Pain Orientation  Right;Lower    Pain Descriptors / Indicators  Sore    Pain Type  Chronic pain    Pain Radiating Towards  none currently, but can travel posterior buttock    Pain Onset  More than a month ago    Pain Frequency  Intermittent    Aggravating Factors   laying flat on back, bending forward, lifting    Pain Relieving  Factors  walking or being up; heat/cold    Effect of Pain on Daily Activities  pain with lifting toddler         [facility] PT Assessment - 06/03/19 0001      Assessment   Medical Diagnosis  back pain unspecified    Referring Provider (PT)  Jody Bovard-Stuckert    Onset Date/Surgical Date  --   sometime early 2020   Next MD Visit  regular OB checkups    Prior Therapy  2-3 years ago OPPT       Precautions   Precautions  Other (comment)    Precaution Comments  pregnancy       Balance Screen   Has the patient fallen in the past 6 months  No    Has the patient had a decrease in activity level because of a fear of falling?   No    Is the patient reluctant to leave their home because of a fear of falling?   No      Home Public house manager residence      Prior Function   Vocation  Full time employment    [employer]  Leisure  running atleast every other day on her 7 days off work       Charity fundraiser Status  Within Functional Limits for tasks assessed      Sensation   Additional Comments  denies numbness/tingling      Posture/Postural Control   Posture Comments  BLE squat below 90 without pain, (+) weight shift left noted at end of squat x5      ROM / Strength   AROM / PROM / Strength  AROM;Strength      AROM   Overall AROM Comments  Lumbar flexion and rotation within normal limits/pain free, lumbar extension limited 50% and pain end range      Strength   Overall Strength Comments  BLE 5/5 MMT except external rotation/internal rotation 4/5 MMT bilaterally      Palpation   Palpation comment  tenderness L3-L5, lumbar paraspinals/QL on Rt                 Objective measurements completed on examination: See above findings.      [facility] Adult PT Treatment/Exercise - 06/03/19 0001      Self-Care   Self-Care  Posture    Posture  sleeping adjustments      Exercises   Exercises  Lumbar      Lumbar  Exercises: Supine   Bridge  10 reps    Bridge Limitations  LE staggered with contralateral UE row (red TB)       Lumbar Exercises: Sidelying   Other Sidelying Lumbar Exercises  plank elbows/knees x5 sec Lt side for HEP demo       Lumbar Exercises: Quadruped   Single Arm Raises Limitations  able to complete each side without difficulty    Straight Leg Raise  5 reps    Straight Leg Raises Limitations  (+) discomfort Rt SI joint with position, improved with active Rt hip extension     Opposite Arm/Leg Raise  Right arm/Left leg;5 reps    Opposite Arm/Leg Raise Limitations  increased difficulty with Rt UE/Lt LE reach              PT Education - 06/03/19 1544    Education Details  sleeping postures in sidelying; implemented HEP    Person(s) Educated  Patient    Methods  Explanation;Demonstration;Verbal cues;Handout    Comprehension  Verbalized understanding;Returned demonstration       PT Short Term Goals - 06/03/19 1554      PT SHORT TERM GOAL #1   Title  Pt will be independent with her initial HEP to increase body mechanics awareness and trunk strength.    Time  5    Period  Weeks    Status  New    Target Date  07/08/19      PT SHORT TERM GOAL #2   Title  Pt will have improved trunk stability evident by her ability to complete quadruped UE/LE reach x10 reps each side without trunk rotation or hip drop compensation    Time  5    Period  Weeks    Status  New        PT Long Term Goals - 06/03/19 1555      PT LONG TERM GOAL #1   Title  Pt will report being able to lift her daughter throughout the day without increase in Rt low back pain    Time  10    Period  Weeks    Status  New  Target Date  08/13/19      PT LONG TERM GOAL #2   Title  Pt will report atlteast 60% improvement in her initial back pain with daily activity and sleeping.    Time  10    Period  Weeks    Status  New      PT LONG TERM GOAL #3   Title  Pt will be able to verbalize sleep position  adjustments for comfort throughout the night.    Time  10    Period  Weeks    Status  New      PT LONG TERM GOAL #4   Title  Pt will have 5/5 MMT strength with hip internal and external rotation which will help with daily activity and caring for her daughter.    Time  10    Period  Weeks    Status  New             Plan - 06/03/19 1547    Clinical Impression Statement  Pt is a pleasant 33 y.o F referred to OPPT with complaints of recent exacerbation of Rt sided low back pain. Pt is currently [redacted] weeks pregnant with her second child and is having increased pain with laying on her back, bending forward and lifting her toddler throughout the day. Pt has good flexibility of the hips, with 5/5 MMT throughout except with hip rotation limited to 4/5MMT. Pt has tenderness of the lower lumbar region and pain with hip extension in deeper ranges of hip flexion. In addition, she has lack of trunk stability and strength and would benefit from skilled PT to address these limitations and provide necessary exercise modifications to maintain her strength and independence as her pregnancy progresses.    Personal Factors and Comorbidities  Age;Fitness    Examination-Activity Limitations  Bend;Lift;Sleep    Examination-Participation Restrictions  Other    Stability/Clinical Decision Making  Stable/Uncomplicated    Clinical Decision Making  Low    Rehab Potential  Excellent    PT Frequency  Biweekly    PT Duration  --   10 weeks   PT Treatment/Interventions  ADLs/Self Care Home Management;Cryotherapy;Moist Heat;Electrical Stimulation;Neuromuscular re-education;Therapeutic exercise;Therapeutic activities;Patient/family education;Manual techniques;Passive range of motion;Taping    PT Next Visit Plan  Manual to sacrum/low lumbar spine if needed; hip rotation strength; trunk stability progression    PT Home Exercise Plan  [id number]    Consulted and Agree with Plan of Care  Patient       Patient will benefit  from skilled therapeutic intervention in order to improve the following deficits and impairments:  Decreased activity tolerance, Decreased strength, Postural dysfunction, Increased muscle spasms, Improper body mechanics, Pain  Visit Diagnosis: Low back pain, unspecified back pain laterality, unspecified chronicity, unspecified whether sciatica present  Muscle weakness (generalized)  Abnormal posture     Problem List Patient Active Problem List   Diagnosis Date Noted  . Vitamin D deficiency 12/01/2018  . Hyperlipidemia 12/01/2018  . Lumbar back pain 12/19/2014  . Facet syndrome, lumbar 06/29/2014  . Allergic rhinitis 03/31/2014  . Back pain, thoracic 08/09/2012  . Physical exam 12/18/2011  . Acne 06/27/2011    4:05 PM,06/03/19 Donita BrooksSara Shuna Graves PT, DPT [facility]  [phone]  [facility] [address], [address] [place], [place], [address] Phone: [phone]   Fax:  [phone]  Name: [name] MRN: [id number] Date of Birth: July 09, 1985

## 2019-06-03 NOTE — Patient Instructions (Signed)
Access Code: [email or web address]  URL: [email or web address]  Date: 06/03/2019  Prepared by: Sherol Dade   Exercises  Supine Bridge - 10 reps - 3 sets - 1x daily - 7x weekly  Side Plank on Knees - 3 reps - 5-10 hold - 1x daily - 7x weekly  Quadruped Alternating Leg Extensions - 5 reps - 2 sets - 1x daily - 7x weekly    [facility] [address], [place], [place] [address] Phone # [phone] Fax [phone]

## 2019-06-11 ENCOUNTER — Encounter: Payer: Self-pay | Admitting: Family Medicine

## 2019-06-14 ENCOUNTER — Other Ambulatory Visit: Payer: Self-pay

## 2019-06-14 ENCOUNTER — Encounter: Payer: Self-pay | Admitting: Physical Therapy

## 2019-06-14 ENCOUNTER — Ambulatory Visit: Payer: No Typology Code available for payment source | Admitting: Physical Therapy

## 2019-06-14 DIAGNOSIS — R293 Abnormal posture: Secondary | ICD-10-CM

## 2019-06-14 DIAGNOSIS — M545 Low back pain, unspecified: Secondary | ICD-10-CM

## 2019-06-14 DIAGNOSIS — M6281 Muscle weakness (generalized): Secondary | ICD-10-CM

## 2019-06-14 NOTE — Therapy (Signed)
[facility] [address], [place], [place], [address] Phone: [phone]   Fax:  [phone]  Physical Therapy Treatment  Patient Details  Name: Teresa Graves MRN: [id number] Date of Birth: 08-03-1985 Referring Provider (PT): Jody Bovard-Stuckert   Encounter Date: 06/14/2019  PT End of Session - 06/14/19 1229    Visit Number  2    Date for PT Re-Evaluation  08/13/19    Authorization Type  [employer]    Authorization Time Period  06/03/19 to 08/13/19    PT Start Time  1145    PT Stop Time  1225    PT Time Calculation (min)  40 min    Activity Tolerance  No increased pain;Patient tolerated treatment well    Behavior During Therapy  [facility] for tasks assessed/performed       Past Medical History:  Diagnosis Date  . Lumbar herniated disc     Past Surgical History:  Procedure Laterality Date  . ear lobe repaired      There were no vitals filed for this visit.  Subjective Assessment - 06/14/19 1538    Subjective  Always hurts about 2/10.  Pt states the exercises are not comfortable but pain doesn't last and they are doable.    Currently in Pain?  Yes    Pain Score  3     Pain Location  Back    Pain Orientation  Right;Lower    Multiple Pain Sites  No                       [facility] Adult PT Treatment/Exercise - 06/14/19 0001      Lumbar Exercises: Standing   Other Standing Lumbar Exercises  TrA at the wall and reaching up with alt UE      Lumbar Exercises: Seated   Other Seated Lumbar Exercises  IR/ER with red band - 2 x 10 each side      Lumbar Exercises: Sidelying   Clam  Both;20 reps    Hip Abduction  15 reps;Both      Lumbar Exercises: Quadruped   [place]cat/Old Horse  5 reps   arching back felt better   Straight Leg Raise  5 reps    Straight Leg Raises Limitations  quadruped then modified    Opposite Arm/Leg Raise  Right arm/Left leg;Left arm/Right leg   3 reps quad      Manual Therapy   Manual Therapy  Soft tissue mobilization    Soft tissue mobilization  sacral distraction and Lt gluteals               PT Short Term Goals - 06/03/19 1554      PT SHORT TERM GOAL #1   Title  Pt will be independent with her initial HEP to increase body mechanics awareness and trunk strength.    Time  5    Period  Weeks    Status  New    Target Date  07/08/19      PT SHORT TERM GOAL #2   Title  Pt will have improved trunk stability evident by her ability to complete quadruped UE/LE reach x10 reps each side without trunk rotation or hip drop compensation    Time  5    Period  Weeks    Status  New        PT Long Term Goals - 06/03/19 1555      PT LONG TERM GOAL #1  Title  Pt will report being able to lift her daughter throughout the day without increase in Rt low back pain    Time  10    Period  Weeks    Status  New    Target Date  08/13/19      PT LONG TERM GOAL #2   Title  Pt will report atlteast 60% improvement in her initial back pain with daily activity and sleeping.    Time  10    Period  Weeks    Status  New      PT LONG TERM GOAL #3   Title  Pt will be able to verbalize sleep position adjustments for comfort throughout the night.    Time  10    Period  Weeks    Status  New      PT LONG TERM GOAL #4   Title  Pt will have 5/5 MMT strength with hip internal and external rotation which will help with daily activity and caring for her daughter.    Time  10    Period  Weeks    Status  New            Plan - 06/14/19 1540    Clinical Impression Statement  Pt responded well to most exercises today.  Pt with pain radiating into Rt buttocks during quadruped and modified quadruped exercises.  Increased pain did not last once out of that position.  Pt was given exercises as seen to add to HEP.  She reports that she can not do too many in one day so she was told to do only the new ones for now.  She was also educated in monitoring her pain  and make sure she is not getting a lot of radicular pain and that it doesn't last once out of a particular position.    PT Treatment/Interventions  ADLs/Self Care Home Management;Cryotherapy;Moist Heat;Electrical Stimulation;Neuromuscular re-education;Therapeutic exercise;Therapeutic activities;Patient/family education;Manual techniques;Passive range of motion;Taping    PT Next Visit Plan  porgress core strength as tolerated; continue manual if needed    PT Home Exercise Plan  [email or web address]    Consulted and Agree with Plan of Care  Patient       Patient will benefit from skilled therapeutic intervention in order to improve the following deficits and impairments:  Decreased activity tolerance, Decreased strength, Postural dysfunction, Increased muscle spasms, Improper body mechanics, Pain  Visit Diagnosis: Low back pain, unspecified back pain laterality, unspecified chronicity, unspecified whether sciatica present  Muscle weakness (generalized)  Abnormal posture     Problem List Patient Active Problem List   Diagnosis Date Noted  . Vitamin D deficiency 12/01/2018  . Hyperlipidemia 12/01/2018  . Lumbar back pain 12/19/2014  . Facet syndrome, lumbar 06/29/2014  . Allergic rhinitis 03/31/2014  . Back pain, thoracic 08/09/2012  . Physical exam 12/18/2011  . Acne 06/27/2011    Junious Silk, PT 06/14/2019, 3:43 PM  [facility] [address], [address] [place], [place], [address] Phone: [phone]   Fax:  [phone]  Name: Teresa Graves MRN: [id number] Date of Birth: Nov 09, 1985

## 2019-06-25 NOTE — L&D Delivery Note (Signed)
Delivery Note Pt progressed rapidly to C/C/+2-3 and pushed w 3 contractions for delivery.  At 10:54 PM a viable and healthy female was delivered via Vaginal, Spontaneous (Presentation: Left Occiput Anterior).  APGAR: 9, 9; weight P .   Placenta status: Manual removal, Abnormal.  Cord: 3 vessels with the following complications: Velamentous.    Anesthesia: Epidural Episiotomy: None Lacerations: 1st degree;Vaginal Suture Repair: 3.0 vicryl rapide Est. Blood Loss (mL): 112cc  Mom to postpartum.  Baby to Couplet care / Skin to Skin.  Teresa Graves 11/06/2019, 12:27 AM  Br/RI/Tdap in PNC/AB+/Contra?

## 2019-06-28 ENCOUNTER — Ambulatory Visit (INDEPENDENT_AMBULATORY_CARE_PROVIDER_SITE_OTHER): Payer: No Typology Code available for payment source | Admitting: Family Medicine

## 2019-06-28 ENCOUNTER — Encounter: Payer: Self-pay | Admitting: Family Medicine

## 2019-06-28 ENCOUNTER — Other Ambulatory Visit: Payer: Self-pay

## 2019-06-28 DIAGNOSIS — M7662 Achilles tendinitis, left leg: Secondary | ICD-10-CM

## 2019-06-28 NOTE — Progress Notes (Signed)
I have discussed the procedure for the virtual visit with the patient who has given consent to proceed with assessment and treatment.   Pt unable to obtain vitals.   Teresa Graves, CMA     

## 2019-06-28 NOTE — Progress Notes (Signed)
   Virtual Visit via Video   I connected with patient on 06/28/19 at  9:30 AM EST by a video enabled telemedicine application and verified that I am speaking with the correct person using two identifiers.  Location patient: Home Location provider: [facility], Office Persons participating in the virtual visit: Patient, Provider, CMA (Jess B)  I discussed the limitations of evaluation and management by telemedicine and the availability of in person appointments. The patient expressed understanding and agreed to proceed.  Subjective:   HPI:   Achilles Pain- L sided, 'it hurts.  A lot'.  Sxs started ~3 weeks ago.  No pain while running but painful after running.  Pain is worse w/ walking, stairs.  Improves w/ rest.  Wears running shoes for most things but wears Crocs at work.  Helps w/ ice.  Currently pregnant.  Not stretching b/c 'it's so painful' and this scares her.  Pt is very frustrated.  ROS:   See pertinent positives and negatives per HPI.  Patient Active Problem List   Diagnosis Date Noted  . Vitamin D deficiency 12/01/2018  . Hyperlipidemia 12/01/2018  . Lumbar back pain 12/19/2014  . Facet syndrome, lumbar 06/29/2014  . Allergic rhinitis 03/31/2014  . Back pain, thoracic 08/09/2012  . Physical exam 12/18/2011  . Acne 06/27/2011    Social History   Tobacco Use  . Smoking status: Never Smoker  . Smokeless tobacco: Never Used  Substance Use Topics  . Alcohol use: Yes    Comment: socially    Current Outpatient Medications:  .  acetaminophen (TYLENOL) 500 MG tablet, Tylenol  PRN, Disp: , Rfl:  .  polyethylene glycol (MIRALAX) 17 g packet, Miralax  PRN, Disp: , Rfl:  .  Prenatal Vit-Fe Fumarate-FA (PRENATAL MULTIVITAMIN) TABS tablet, Take 1 tablet by mouth daily at 12 noon., Disp: , Rfl:   Allergies  Allergen Reactions  . Doxycycline     Major vomiting     Objective:   There were no vitals taken for this visit. AAOx3, NAD NCAT, EOMI No obvious CN  deficits Coloring WNL Pt is able to speak clearly, coherently without shortness of breath or increased work of breathing.  Thought process is linear.  Mood is appropriate.   Assessment and Plan:   L achilles tendonitis- new.  Pt is unable to take NSAIDs due to pregnancy.  Discussed need for good, cushioned shoes if not a boot at this time.  She is fearful to stretch b/c of degree of pain.  Continue to ice.  Refer to Sports Med for evaluation and tx ASAP.  Pt expressed understanding and is in agreement w/ plan.    Neena Rhymes, MD 06/28/2019

## 2019-06-29 ENCOUNTER — Encounter: Payer: Self-pay | Admitting: Physical Therapy

## 2019-06-29 ENCOUNTER — Ambulatory Visit
Payer: No Typology Code available for payment source | Attending: Obstetrics and Gynecology | Admitting: Physical Therapy

## 2019-06-29 ENCOUNTER — Other Ambulatory Visit: Payer: Self-pay

## 2019-06-29 DIAGNOSIS — M6281 Muscle weakness (generalized): Secondary | ICD-10-CM | POA: Diagnosis present

## 2019-06-29 DIAGNOSIS — R293 Abnormal posture: Secondary | ICD-10-CM | POA: Diagnosis present

## 2019-06-29 DIAGNOSIS — M545 Low back pain, unspecified: Secondary | ICD-10-CM

## 2019-06-29 NOTE — Therapy (Signed)
[facility] [address], [address] [place], [place], [address] Phone: [phone]   Fax:  [phone]  Physical Therapy Treatment  Patient Details  Name: Teresa Graves MRN: [id number] Date of Birth: 10/26/1985 Referring Provider (PT): Jody Bovard-Stuckert   Encounter Date: 06/29/2019  PT End of Session - 06/29/19 1004    Visit Number  3    Date for PT Re-Evaluation  08/13/19    Authorization Type  [employer]    Authorization Time Period  06/03/19 to 08/13/19    PT Start Time  0846    PT Stop Time  0936    PT Time Calculation (min)  50 min    Activity Tolerance  No increased pain;Patient tolerated treatment well    Behavior During Therapy  [facility] for tasks assessed/performed       Past Medical History:  Diagnosis Date  . Lumbar herniated disc     Past Surgical History:  Procedure Laterality Date  . ear lobe repaired      There were no vitals filed for this visit.  Subjective Assessment - 06/29/19 0849    Subjective  Pt states that she feels her pain may be getting worse. Laying down is still tough. She is also having Lt achilles tendon issues which are not improving much.    Currently in Pain?  Yes    Pain Score  4     Pain Location  Buttocks    Pain Orientation  Right;Lateral    Pain Descriptors / Indicators  Aching    Pain Type  Chronic pain    Aggravating Factors   sleeping on either side (more on the Rt)                       [facility] Adult PT Treatment/Exercise - 06/29/19 0001      Lumbar Exercises: Stretches   Other Lumbar Stretch Exercise  long sitting ER/IR AROM stretch x10 reps each side     Other Lumbar Stretch Exercise  Rt gluteal foam rolling x3 min      Lumbar Exercises: Standing   Other Standing Lumbar Exercises  wall squat with red physioball x10 reps, free squat with row blue TB x12 reps       Lumbar Exercises: Sidelying   Clam Limitations  5 reps each, (+) pain Rt side     Other Sidelying Lumbar Exercises  Lt side plank elbow/knee with Rt horizontal abduction x5 reps for HEP demo       Lumbar Exercises: Quadruped   Other Quadruped Lumbar Exercises  hip extension rollout on red physioball x7 reps each side       Manual Therapy   Soft tissue mobilization  trigger point release Rt glute med             PT Education - 06/29/19 1006    Education Details  adjustments to cardio/options for home due to achilles pain    Person(s) Educated  Patient    Methods  Explanation    Comprehension  Verbalized understanding       PT Short Term Goals - 06/03/19 1554      PT SHORT TERM GOAL #1   Title  Pt will be independent with her initial HEP to increase body mechanics awareness and trunk strength.    Time  5    Period  Weeks    Status  New    Target Date  07/08/19  PT SHORT TERM GOAL #2   Title  Pt will have improved trunk stability evident by her ability to complete quadruped UE/LE reach x10 reps each side without trunk rotation or hip drop compensation    Time  5    Period  Weeks    Status  New        PT Long Term Goals - 06/03/19 1555      PT LONG TERM GOAL #1   Title  Pt will report being able to lift her daughter throughout the day without increase in Rt low back pain    Time  10    Period  Weeks    Status  New    Target Date  08/13/19      PT LONG TERM GOAL #2   Title  Pt will report atlteast 60% improvement in her initial back pain with daily activity and sleeping.    Time  10    Period  Weeks    Status  New      PT LONG TERM GOAL #3   Title  Pt will be able to verbalize sleep position adjustments for comfort throughout the night.    Time  10    Period  Weeks    Status  New      PT LONG TERM GOAL #4   Title  Pt will have 5/5 MMT strength with hip internal and external rotation which will help with daily activity and caring for her daughter.    Time  10    Period  Weeks    Status  New            Plan - 06/29/19  1003    Clinical Impression Statement  Pt had reports of increased Rt buttock pain over the past 2 weeks. PT made several adjustments with her HEP to avoid irritating the gluteals. Currently, single leg movements/exercises are more provocative of the Rt buttock discomfort, particularly when attempting to stabilize on that side. Pt felt adequate stretch with seated hip rotation and was educated on ways to foam roll at home. End of session, PT completed soft tissue mobilization the Rt gluteals end of session to further decrease tension in the area. Pt is currently having issues with her achilles tendon on the opposite LE and will require modifications to activity due to this moving forward.    PT Treatment/Interventions  ADLs/Self Care Home Management;Cryotherapy;Moist Heat;Electrical Stimulation;Neuromuscular re-education;Therapeutic exercise;Therapeutic activities;Patient/family education;Manual techniques;Passive range of motion;Taping    PT Next Visit Plan  possible dry needling glutes; progress glute strength/stability and progress towards single leg stability; deadlift    PT Home Exercise Plan  [email or web address]    Consulted and Agree with Plan of Care  Patient       Patient will benefit from skilled therapeutic intervention in order to improve the following deficits and impairments:  Decreased activity tolerance, Decreased strength, Postural dysfunction, Increased muscle spasms, Improper body mechanics, Pain  Visit Diagnosis: Low back pain, unspecified back pain laterality, unspecified chronicity, unspecified whether sciatica present  Muscle weakness (generalized)  Abnormal posture     Problem List Patient Active Problem List   Diagnosis Date Noted  . Vitamin D deficiency 12/01/2018  . Hyperlipidemia 12/01/2018  . Lumbar back pain 12/19/2014  . Facet syndrome, lumbar 06/29/2014  . Allergic rhinitis 03/31/2014  . Back pain, thoracic 08/09/2012  . Physical exam 12/18/2011  . Acne 06/27/2011    10:09 AM,06/29/19 Sherol Dade PT, DPT [place] [facility]  [phone]  [facility] [address], [address] [place], [place], [address] Phone: [phone]   Fax:  [phone]  Name: BRIANY AYE MRN: [id number] Date of Birth: [birth year]

## 2019-06-30 ENCOUNTER — Ambulatory Visit: Payer: Self-pay

## 2019-06-30 ENCOUNTER — Ambulatory Visit: Payer: No Typology Code available for payment source | Admitting: Family Medicine

## 2019-06-30 ENCOUNTER — Other Ambulatory Visit: Payer: Self-pay

## 2019-06-30 ENCOUNTER — Encounter: Payer: Self-pay | Admitting: Family Medicine

## 2019-06-30 VITALS — BP 106/68 | Ht 68.0 in | Wt 193.6 lb

## 2019-06-30 DIAGNOSIS — M7662 Achilles tendinitis, left leg: Secondary | ICD-10-CM

## 2019-06-30 NOTE — Patient Instructions (Signed)
You have Achilles Tendinopathy Tylenol as needed for pain. Icing 15 minutes at a time 3-4 times a day as needed. Start theraband exercises as directed. We will place a referral to add physical therapy for this as well. Do home exercises on days you don't go to therapy. Avoid uneven ground, hills as much as possible. Heel lifts in shoes or shoes with a natural heel lift. Avoid barefoot walking, flat shoes as well. Use pain as your guide - as long as it stays < 3 on a scale of 1-10 it's ok to walk for exercise and I'm hoping the heel lifts make a big difference. A boot with the heel lifts is an option if you continue to struggle. Follow up in 2 weeks.

## 2019-06-30 NOTE — Progress Notes (Signed)
[facility] [address] [place], [place] [address] Phone: [phone] Fax: [phone]   Patient Name: Teresa Graves Date of Birth: 05-Aug-1985 Medical Record Number: [id number] Gender: female Date of Encounter: 06/30/2019  CC: Left Achilles pain  HPI: Teresa Graves is a 34 year old female who is [redacted] weeks pregnant and is here here c/o left Achilles pain. Pain started 3 weeks ago. No MOI or inciting event, but noticed that after a run. Aggravating factors include running and walking. Alleviating factors include rest and ice. No radiating symptoms. No hx of trauma or injury to area in the past.  No change in running shoe or terrain.  Denies any swelling or skin changes.  Pain is worse at the end of the day.  Patient is currently going to physical therapy for low back disc pathology, but has not worked on ankle at all.   Past Medical History:  Diagnosis Date  . Lumbar herniated disc     Current Outpatient Medications on File Prior to Visit  Medication Sig Dispense Refill  . acetaminophen (TYLENOL) 500 MG tablet Tylenol  PRN    . polyethylene glycol (MIRALAX) 17 g packet Miralax  PRN    . Prenatal Vit-Fe Fumarate-FA (PRENATAL MULTIVITAMIN) TABS tablet Take 1 tablet by mouth daily at 12 noon.     No current facility-administered medications on file prior to visit.    Past Surgical History:  Procedure Laterality Date  . ear lobe repaired      Allergies  Allergen Reactions  . Doxycycline     Major vomiting     Social History   Socioeconomic History  . Marital status: Married    Spouse name: Not on file  . Number of children: Not on file  . Years of education: Not on file  . Highest education level: Not on file  Occupational History  . Not on file  Tobacco Use  . Smoking status: Never Smoker  . Smokeless tobacco: Never Used  Substance and Sexual Activity  . Alcohol use: Yes    Comment: socially  . Drug use: No  . Sexual  activity: Yes    Birth control/protection: None  Other Topics Concern  . Not on file  Social History Narrative  . Not on file   Social Determinants of Health   Financial Resource Strain:   . Difficulty of Paying Living Expenses: Not on file  Food Insecurity:   . Worried About Programme researcher, broadcasting/film/video in the Last Year: Not on file  . Ran Out of Food in the Last Year: Not on file  Transportation Needs:   . Lack of Transportation (Medical): Not on file  . Lack of Transportation (Non-Medical): Not on file  Physical Activity:   . Days of Exercise per Week: Not on file  . Minutes of Exercise per Session: Not on file  Stress:   . Feeling of Stress : Not on file  Social Connections:   . Frequency of Communication with Friends and Family: Not on file  . Frequency of Social Gatherings with Friends and Family: Not on file  . Attends Religious Services: Not on file  . Active Member of Clubs or Organizations: Not on file  . Attends Banker Meetings: Not on file  . Marital Status: Not on file  Intimate Partner Violence:   . Fear of Current or Ex-Partner: Not on file  . Emotionally Abused: Not on file  . Physically Abused: Not on file  . Sexually Abused:  Not on file    Family History  Problem Relation Age of Onset  . Hyperlipidemia Mother   . Hypertension Mother   . Thyroid disease Mother   . Hyperlipidemia Father   . Cancer Father   . Cancer Maternal Aunt   . Sudden death Neg Hx   . Diabetes Neg Hx   . Heart attack Neg Hx     BP 106/68   Ht 5\' 8"  (1.727 m)   Wt 193 lb 9.6 oz (87.8 kg)   BMI 29.44 kg/m   ROS:  See HPI CONST: no F/C, no malaise, no fatigue MSK: See above NEURO: no numbness/tingling, no weakness SKIN: no rash, no lesions HEME: no bleeding, no bruising, no erythema  Objective: GEN: Alert and oriented, NAD, gravid Pulm: Breathing unlabored PSY: normal mood, congruent affect  Left foot No swelling or erythema Full ROM at ankle and toes Full  strength at ankle and toes No TTP Negative compression test Normal transverse and medial arch NVI  Left ankle No gross deformity, swelling, ecchymoses FROM TTP 2 inches proximal to Achilles insertion Negative ant drawer and talar tilt.   Negative syndesmotic compression. Thompsons test negative. NV intact distally.  Right foot No swelling or erythema Full ROM at ankle and toes Full strength at ankle and toes No TTP Negative compression test Normal transverse and medial arch NVI  Right ankle No gross deformity, swelling, ecchymoses FROM No TTP Negative ant drawer and talar tilt.   Negative syndesmotic compression. Thompsons test negative. NV intact distally.  Limited MSK ultrasound Left Achilles tendon The left Achilles tendon was viewed in long and short axis.  There was no intrasubstance tear visualized.  Mid substance of Achilles tendon does demonstrate hypoechoic changes surrounding the tendon with increased vascularization best seen in short axis.  There is minimal retrocalcaneal bursitis without evidence of stress fracture at insertion.  Impression: Mild Achilles tendinitis  Assessment and Plan:  1.  Left Achilles tendinitis  We provided patient with home exercise program to start and will add formal PT to that she's already doing for her low back.  We also fitted patient with heel lift to give some support.  Given that patient is pregnant, we recommend against nitroglycerin or anti-inflammatory at this time.  Continue ice, Tylenol and relative rest for the time being.   Lanier Clam, DO, ATC Sports Medicine Fellow

## 2019-07-13 ENCOUNTER — Ambulatory Visit: Payer: No Typology Code available for payment source | Admitting: Physical Therapy

## 2019-07-14 ENCOUNTER — Other Ambulatory Visit: Payer: Self-pay

## 2019-07-14 ENCOUNTER — Ambulatory Visit: Payer: No Typology Code available for payment source | Admitting: Family Medicine

## 2019-07-14 ENCOUNTER — Encounter: Payer: Self-pay | Admitting: Physical Therapy

## 2019-07-14 ENCOUNTER — Ambulatory Visit: Payer: No Typology Code available for payment source | Admitting: Physical Therapy

## 2019-07-14 DIAGNOSIS — M545 Low back pain, unspecified: Secondary | ICD-10-CM

## 2019-07-14 DIAGNOSIS — M6281 Muscle weakness (generalized): Secondary | ICD-10-CM

## 2019-07-14 DIAGNOSIS — R293 Abnormal posture: Secondary | ICD-10-CM

## 2019-07-14 NOTE — Patient Instructions (Signed)
Access Code: [email or web address]  URL: [email or web address]  Date: 07/14/2019  Prepared by: Donita Brooks    Exercises Supine Bridge- 10 reps- 3 sets- 1x daily- 7x weekly  Side Plank on Knees- 10 reps- 3 sets- 1x daily- 7x weekly  Standing Row with Anchored Resistance- 10 reps- 3 sets- 1x daily- 7x weekly  Quadruped Leg Lifts- 5 reps- 1-2 sets- 1x daily- 7x weekly    [facility] [address] [place], [place] [address] Phone # [phone] Fax [phone]

## 2019-07-14 NOTE — Therapy (Signed)
[facility] [address] [place], [place], [address] Phone: [phone]   Fax:  [phone]  Physical Therapy Treatment  Patient Details  Name: Teresa Graves MRN: [id number] Date of Birth: Jan 07, 1986 Referring Provider (PT): Jody Bovard-Stuckert   Encounter Date: 07/14/2019  PT End of Session - 07/14/19 0848    Visit Number  4    Date for PT Re-Evaluation  08/13/19    Authorization Type  [employer]    Authorization Time Period  06/03/19 to 08/13/19    PT Start Time  0802    PT Stop Time  0845    PT Time Calculation (min)  43 min    Activity Tolerance  No increased pain;Patient tolerated treatment well    Behavior During Therapy  [facility] for tasks assessed/performed       Past Medical History:  Diagnosis Date  . Lumbar herniated disc     Past Surgical History:  Procedure Laterality Date  . ear lobe repaired      There were no vitals filed for this visit.  Subjective Assessment - 07/14/19 0807    Subjective  Pt states that things are going well. She had some issues with her sleeping when using the body pillow. Her glute bridge seems to make her pain worse but not all the time.    Currently in Pain?  No/denies                       [facility] Adult PT Treatment/Exercise - 07/14/19 0001      Lumbar Exercises: Sidelying   Other Sidelying Lumbar Exercises  Lt side plank lift with Rt UE horizontal abduction x5 reps for HEP demo       Lumbar Exercises: Quadruped   [place]/Old Horse  10 reps    Straight Leg Raise  5 reps    Straight Leg Raises Limitations  PT cuing abdominals for preparation of hip extension - pain free     Other Quadruped Lumbar Exercises  child's pose stretch       Manual Therapy   Manual therapy comments  grade III-IV sacral distraction and extension stretch in sidelying     Soft tissue mobilization  trigger point release Rt gluteals, STM Rt gluteals (proximal)              PT Education - 07/14/19 0848    Education Details  technique with therex    Person(s) Educated  Patient    Methods  Explanation    Comprehension  Verbalized understanding       PT Short Term Goals - 06/03/19 1554      PT SHORT TERM GOAL #1   Title  Pt will be independent with her initial HEP to increase body mechanics awareness and trunk strength.    Time  5    Period  Weeks    Status  New    Target Date  07/08/19      PT SHORT TERM GOAL #2   Title  Pt will have improved trunk stability evident by her ability to complete quadruped UE/LE reach x10 reps each side without trunk rotation or hip drop compensation    Time  5    Period  Weeks    Status  New        PT Long Term Goals - 06/03/19 1555      PT LONG TERM GOAL #1   Title  Pt will report being able  to lift her daughter throughout the day without increase in Rt low back pain    Time  10    Period  Weeks    Status  New    Target Date  08/13/19      PT LONG TERM GOAL #2   Title  Pt will report atlteast 60% improvement in her initial back pain with daily activity and sleeping.    Time  10    Period  Weeks    Status  New      PT LONG TERM GOAL #3   Title  Pt will be able to verbalize sleep position adjustments for comfort throughout the night.    Time  10    Period  Weeks    Status  New      PT LONG TERM GOAL #4   Title  Pt will have 5/5 MMT strength with hip internal and external rotation which will help with daily activity and caring for her daughter.    Time  10    Period  Weeks    Status  New            Plan - 07/14/19 8338    Clinical Impression Statement  Pt arrived without low back and Rt gluteal pain. She has been working on her HEP throughout the weeks and adjusting participation depending on the amount of pain she is experiencing at the time. Pt has excessive sacral extension and responded well to mobilization and stretch to the sacrum during today's session. She was able to  complete quadruped hip extension on each LE without increase in Rt buttock pain. HEP was updated and pt demonstrated good understanding of this.    PT Treatment/Interventions  ADLs/Self Care Home Management;Cryotherapy;Moist Heat;Electrical Stimulation;Neuromuscular re-education;Therapeutic exercise;Therapeutic activities;Patient/family education;Manual techniques;Passive range of motion;Taping    PT Next Visit Plan  possible dry needling glutes; progress glute strength/stability and progress towards single leg stability; deadlift    PT Home Exercise Plan  [email or web address]    Consulted and Agree with Plan of Care  Patient       Patient will benefit from skilled therapeutic intervention in order to improve the following deficits and impairments:  Decreased activity tolerance, Decreased strength, Postural dysfunction, Increased muscle spasms, Improper body mechanics, Pain  Visit Diagnosis: Low back pain, unspecified back pain laterality, unspecified chronicity, unspecified whether sciatica present  Muscle weakness (generalized)  Abnormal posture     Problem List Patient Active Problem List   Diagnosis Date Noted  . Vitamin D deficiency 12/01/2018  . Hyperlipidemia 12/01/2018  . Lumbar back pain 12/19/2014  . Facet syndrome, lumbar 06/29/2014  . Allergic rhinitis 03/31/2014  . Back pain, thoracic 08/09/2012  . Physical exam 12/18/2011  . Acne 06/27/2011    11:06 AM,07/14/19 Donita Brooks PT, DPT [facility]  [phone]  [facility] [address], [address] [place], [place], [address] Phone: [phone]   Fax:  [phone]  Name: Teresa Graves MRN: [id number] Date of Birth: 10-18-1985

## 2019-07-27 ENCOUNTER — Ambulatory Visit
Payer: No Typology Code available for payment source | Attending: Obstetrics and Gynecology | Admitting: Physical Therapy

## 2019-07-27 ENCOUNTER — Encounter: Payer: Self-pay | Admitting: Physical Therapy

## 2019-07-27 ENCOUNTER — Other Ambulatory Visit: Payer: Self-pay

## 2019-07-27 DIAGNOSIS — R293 Abnormal posture: Secondary | ICD-10-CM | POA: Diagnosis present

## 2019-07-27 DIAGNOSIS — M6281 Muscle weakness (generalized): Secondary | ICD-10-CM | POA: Insufficient documentation

## 2019-07-27 DIAGNOSIS — M545 Low back pain, unspecified: Secondary | ICD-10-CM

## 2019-07-27 NOTE — Patient Instructions (Signed)
Access Code: [email or web address]  URL: [email or web address]  Date: 07/27/2019  Prepared by: Donita Brooks    Exercises Standing Row with Anchored Resistance- 10 reps- 3 sets- 1x daily- 7x weekly  Shoulder extension with resistance - Neutral- 10 reps- 3 sets- 1x daily- 7x weekly  Supine bridge off the edge of couch- 10 reps- 3 sets- 1x daily- 7x weekly  Half Kneeling Hip Flexor Stretch with Chair- 3 reps- 30 hold- 1x daily- 7x weekly   [facility] [address], [address] [place], [place] [address] Phone # [phone] Fax [phone]

## 2019-07-27 NOTE — Therapy (Signed)
[facility] [address], [place], [place], [address] Phone: [phone]   Fax:  [phone]  Physical Therapy Treatment  Patient Details  Name: Teresa Graves MRN: [id number] Date of Birth: [birth year] Referring Provider (PT): Jody Bovard-Stuckert   Encounter Date: 07/27/2019  PT End of Session - 07/27/19 1036    Visit Number  5    Date for PT Re-Evaluation  08/13/19    Authorization Type  [employer]    Authorization Time Period  06/03/19 to 08/13/19    PT Start Time  0930    PT Stop Time  1021    PT Time Calculation (min)  51 min    Activity Tolerance  No increased pain;Patient tolerated treatment well    Behavior During Therapy  [facility] for tasks assessed/performed       Past Medical History:  Diagnosis Date  . Lumbar herniated disc     Past Surgical History:  Procedure Laterality Date  . ear lobe repaired      There were no vitals filed for this visit.  Subjective Assessment - 07/27/19 0932    Subjective  Pt states that things are going ok. She is still having pain at night and with her glute bridges.    Currently in Pain?  Yes    Pain Score  3     Pain Location  Sacrum    Pain Orientation  Right    Pain Descriptors / Indicators  Sore    Pain Type  Chronic pain    Pain Radiating Towards  none    Pain Frequency  Intermittent    Aggravating Factors   running, pressure on the sacrum                       [facility] Adult PT Treatment/Exercise - 07/27/19 0001      Lumbar Exercises: Stretches   Hip Flexor Stretch  2 reps;Left;Right;30 seconds    Hip Flexor Stretch Limitations  standing with LE in chair     Piriformis Stretch  Left;Right;2 reps    Piriformis Stretch Limitations  supine figure 4       Lumbar Exercises: Standing   Shoulder Extension  Power Tower;Strengthening;Both;10 reps    Shoulder Extension Limitations  #30 x2 sets     Other Standing Lumbar Exercises  staggered  stance trunk rotation green TB x15 reps each direction      Lumbar Exercises: Supine   Clam  15 reps    Clam Limitations  red TB, abdominal support encouraged    Other Supine Lumbar Exercises  BLE bridge off edge of table x8 reps, HEP demo       Manual Therapy   Manual Therapy  Taping   Star pattern over Rt PSIS for increased proprioception   Manual therapy comments  grade III-IV sacral distraction and flexion stretch in sidelying     Soft tissue mobilization  STM Rt glute max             PT Education - 07/27/19 1035    Education Details  implications for kinesiotape; proper coordination of pelvic floor and deep abdominals during activity    Person(s) Educated  Patient    Methods  Explanation;Verbal cues    Comprehension  Verbalized understanding;Returned demonstration       PT Short Term Goals - 06/03/19 1554      PT SHORT TERM GOAL #1   Title  Pt will be independent  with her initial HEP to increase body mechanics awareness and trunk strength.    Time  5    Period  Weeks    Status  New    Target Date  07/08/19      PT SHORT TERM GOAL #2   Title  Pt will have improved trunk stability evident by her ability to complete quadruped UE/LE reach x10 reps each side without trunk rotation or hip drop compensation    Time  5    Period  Weeks    Status  New        PT Long Term Goals - 06/03/19 1555      PT LONG TERM GOAL #1   Title  Pt will report being able to lift her daughter throughout the day without increase in Rt low back pain    Time  10    Period  Weeks    Status  New    Target Date  08/13/19      PT LONG TERM GOAL #2   Title  Pt will report atlteast 60% improvement in her initial back pain with daily activity and sleeping.    Time  10    Period  Weeks    Status  New      PT LONG TERM GOAL #3   Title  Pt will be able to verbalize sleep position adjustments for comfort throughout the night.    Time  10    Period  Weeks    Status  New      PT LONG TERM  GOAL #4   Title  Pt will have 5/5 MMT strength with hip internal and external rotation which will help with daily activity and caring for her daughter.    Time  10    Period  Weeks    Status  New            Plan - 07/27/19 1036    Clinical Impression Statement  Pt continues to have issues with Rt SI joint discomfort throughout her weeks. HEP is better, but she is having pain with direct pressure on the sacrum as well as concerns over her ability to accurately activate her deep abdominals during several of her exercises. PT provided verbal/tactile cuing throughout today's session to improve technique and decrease excessive lumbar lordosis/sacral extension. Pt reported improvements in pain with exercise modifications and her HEP was updated accordingly. Ended with manual techniques and taping to improve sacral stability and decrease muscle spasm. Pt felt good relief following this.    PT Treatment/Interventions  ADLs/Self Care Home Management;Cryotherapy;Moist Heat;Electrical Stimulation;Neuromuscular re-education;Therapeutic exercise;Therapeutic activities;Patient/family education;Manual techniques;Passive range of motion;Taping    PT Next Visit Plan  progress glute strength/hip rotation strength; tape abdominals and f/u on sacrum tape; mobilizations to hip    PT Home Exercise Plan  [id number]    Consulted and Agree with Plan of Care  Patient       Patient will benefit from skilled therapeutic intervention in order to improve the following deficits and impairments:  Decreased activity tolerance, Decreased strength, Postural dysfunction, Increased muscle spasms, Improper body mechanics, Pain  Visit Diagnosis: Low back pain, unspecified back pain laterality, unspecified chronicity, unspecified whether sciatica present  Muscle weakness (generalized)  Abnormal posture     Problem List Patient Active Problem List   Diagnosis Date Noted  . Vitamin D deficiency 12/01/2018  .  Hyperlipidemia 12/01/2018  . Lumbar back pain 12/19/2014  . Facet syndrome, lumbar 06/29/2014  . Allergic rhinitis  03/31/2014  . Back pain, thoracic 08/09/2012  . Physical exam 12/18/2011  . Acne 06/27/2011   10:55 AM,07/27/19 Donita Brooks PT, DPT [facility]  [phone]  [facility] [address], [address] [place], [place], [address] Phone: [phone]   Fax:  [phone]  Name: CARMALITA WAKEFIELD MRN: [id number] Date of Birth: Nov 08, 1985

## 2019-07-29 ENCOUNTER — Ambulatory Visit: Payer: No Typology Code available for payment source | Attending: Family Medicine | Admitting: Physical Therapy

## 2019-07-29 ENCOUNTER — Other Ambulatory Visit: Payer: Self-pay

## 2019-07-29 ENCOUNTER — Encounter: Payer: Self-pay | Admitting: Physical Therapy

## 2019-07-29 ENCOUNTER — Ambulatory Visit: Payer: No Typology Code available for payment source | Admitting: Physical Therapy

## 2019-07-29 DIAGNOSIS — R293 Abnormal posture: Secondary | ICD-10-CM | POA: Insufficient documentation

## 2019-07-29 DIAGNOSIS — M545 Low back pain, unspecified: Secondary | ICD-10-CM

## 2019-07-29 DIAGNOSIS — M6281 Muscle weakness (generalized): Secondary | ICD-10-CM | POA: Diagnosis present

## 2019-07-29 NOTE — Therapy (Signed)
[facility] [address] [place], [place], [address] Phone: [phone]   Fax:  [phone]  Physical Therapy Evaluation/One time visit  Patient Details  Name: Teresa Graves MRN: [id number] Date of Birth: December 16, 1985 Referring Provider (PT): Norton Blizzard, MD    Encounter Date: 07/29/2019  PT End of Session - 07/29/19 1736    Visit Number  1   Date for PT Re-Evaluation     Authorization Type  [employer] Employee    Authorization Time Period  07/29/19   PT Start Time  1533    PT Stop Time  1614    PT Time Calculation (min)  41 min    Activity Tolerance  No increased pain;Patient tolerated treatment well    Behavior During Therapy  [facility] for tasks assessed/performed       Past Medical History:  Diagnosis Date  . Lumbar herniated disc     Past Surgical History:  Procedure Laterality Date  . ear lobe repaired      There were no vitals filed for this visit.   Subjective Assessment - 07/29/19 1542    Subjective  Pt states that her Lt achilles tendon started bothering her during her runs back in December. She was running 3-4 miles, 3-5 times every other week. She saw the Dr. in January and was given the shoe inserts and a couple of exercises. She has done well with wearing the inserts and has not had pain for roughly 3 weeks. Typically the pain would be aggravated with walking up stairs at work and throughout the day.    Currently in Pain?  No/denies         [facility] PT Assessment - 07/29/19 0001      Assessment   Medical Diagnosis  Lt achilles tendonitis    Referring Provider (PT)  Norton Blizzard, MD     Onset Date/Surgical Date  --   [facility] December 2020   Next MD Visit  none for this       Balance Screen   Has the patient fallen in the past 6 months  No    Has the patient had a decrease in activity level because of a fear of falling?   No    Is the patient reluctant to leave their home because of a fear of  falling?   No      Prior Function   Leisure  running 2-3 miles a couple of days a week currently       Cognition   Overall Cognitive Status  Within Functional Limits for tasks assessed      Functional Tests   Functional tests  Single Leg Squat      Single Leg Squat   Comments  x10 reps each: (+) hip drop on Rt, pain free       AROM   Overall AROM Comments  Lt and Rt ankle DF 15 deg with knee extended       Strength   Overall Strength Comments  all ankle strength 5/5 MMT except gastroc strength 4/5 on Lt, 5/5 on Rt- pain free both       Palpation   Palpation comment  non tender with palpation to Lt achilles tendon, gastroc, soleus       Ambulation/Gait   Gait Comments  Running assesment: (+) hip drop on Lt with Rt LE stance, otherwise symmetrical step length, arm swing, push off is adequate  Objective measurements completed on examination: See above findings.      [facility] Adult PT Treatment/Exercise - 07/29/19 0001      Self-Care   Self-Care  Other Self-Care Comments    Other Self-Care Comments   impact hip stability impairments can have on running, shoe insert wear and decreasing length of her runs       Lumbar Exercises: Standing   Other Standing Lumbar Exercises  Rt hip abduction isometric against wall 2x5 sec cuing to hike Lt hip              PT Education - 07/29/19 1735    Education Details  see self-care    Person(s) Educated  Patient    Methods  Explanation    Comprehension  Verbalized understanding                Plan - 07/29/19 1736    Clinical Impression Statement  Pt was evaluated today for her recent complaints of Lt achilles tendon pain with running onset approximately 2 months ago. She has been managing conservatively at the recommendation of her referring physician and has seen great improvements in her pain. Pt does have mild limitations in Lt gastroc strength, however her flexibility appears within normal limits.  Pt also has difficulty with Rt hip stabilization in running and single leg stance. PT was able to provide additional education to the pt regarding exercise modification and safe progression and a HEP was provided. She was agreeable to a one time visit at this time and plans to maintain her program on her own moving forward.    Personal Factors and Comorbidities  Age;Time since onset of injury/illness/exacerbation;Fitness    PT Frequency  One time visit    PT Treatment/Interventions  ADLs/Self Care Home Management;Cryotherapy;Moist Heat;Electrical Stimulation;Neuromuscular re-education;Therapeutic exercise;Therapeutic activities;Patient/family education;Manual techniques;Passive range of motion;Taping    PT Next Visit Plan  progress glute strength/hip rotation strength; tape abdominals and f/u on sacrum tape; mobilizations to hip    PT Home Exercise Plan  [id number]    Consulted and Agree with Plan of Care  Patient       Patient will benefit from skilled therapeutic intervention in order to improve the following deficits and impairments:  Decreased activity tolerance, Decreased strength, Postural dysfunction, Increased muscle spasms, Improper body mechanics, Pain  Visit Diagnosis: Low back pain, unspecified back pain laterality, unspecified chronicity, unspecified whether sciatica present  Muscle weakness (generalized)  Abnormal posture     Problem List Patient Active Problem List   Diagnosis Date Noted  . Vitamin D deficiency 12/01/2018  . Hyperlipidemia 12/01/2018  . Lumbar back pain 12/19/2014  . Facet syndrome, lumbar 06/29/2014  . Allergic rhinitis 03/31/2014  . Back pain, thoracic 08/09/2012  . Physical exam 12/18/2011  . Acne 06/27/2011    5:41 PM,07/29/19 Sherol Dade PT, DPT [facility] [address], [place], [place], [address] Phone:  [phone]   Fax:  [phone]  Name: Teresa Graves MRN: [id number] Date of Birth: [birth year]

## 2019-07-29 NOTE — Patient Instructions (Signed)
Heel raises off step x20 Hip abd iso against wall x10  [facility] [address], [address] [place], [place] [address] Phone # [phone] Fax [phone]

## 2019-08-10 ENCOUNTER — Encounter: Payer: No Typology Code available for payment source | Admitting: Physical Therapy

## 2019-08-12 ENCOUNTER — Encounter: Payer: No Typology Code available for payment source | Admitting: Physical Therapy

## 2019-08-23 ENCOUNTER — Ambulatory Visit
Payer: No Typology Code available for payment source | Attending: Obstetrics and Gynecology | Admitting: Physical Therapy

## 2019-08-23 ENCOUNTER — Other Ambulatory Visit: Payer: Self-pay

## 2019-08-23 DIAGNOSIS — R293 Abnormal posture: Secondary | ICD-10-CM | POA: Diagnosis present

## 2019-08-23 DIAGNOSIS — M545 Low back pain, unspecified: Secondary | ICD-10-CM

## 2019-08-23 DIAGNOSIS — M6281 Muscle weakness (generalized): Secondary | ICD-10-CM | POA: Insufficient documentation

## 2019-08-23 NOTE — Therapy (Addendum)
[facility] [address] [place], [place], [address] Phone: [phone]   Fax:  [phone]  Physical Therapy Treatment  Patient Details  Name: Teresa Graves MRN: [id number] Date of Birth: [birth year] Referring Provider (PT): Jody Bovard-Stuckert   Encounter Date: 08/23/2019  PT End of Session - 08/23/19 0211    Visit Number  7    Date for PT Re-Evaluation  11/15/19    Authorization Type  [employer]    Authorization Time Period  until 11/15/19    PT Start Time  1533    PT Stop Time  1614    PT Time Calculation (min)  41 min    Activity Tolerance  No increased pain;Patient tolerated treatment well    Behavior During Therapy  [facility] for tasks assessed/performed       Past Medical History:  Diagnosis Date  . Lumbar herniated disc     Past Surgical History:  Procedure Laterality Date  . ear lobe repaired      There were no vitals filed for this visit.  Subjective Assessment - 08/23/19 1535    Currently in Pain?  Yes    Pain Score  4     Pain Location  Back    Pain Orientation  Right    Pain Descriptors / Indicators  Aching;Sharp    Pain Radiating Towards  down into the Rt buttock and a little into the left    Aggravating Factors   activity or lying down; after running; picking up toddler    Multiple Pain Sites  No         [facility] PT Assessment - 08/23/19 0001      Assessment   Medical Diagnosis  back pain unspecified    Referring Provider (PT)  Jody Bovard-Stuckert    Onset Date/Surgical Date  --   sometime early 2020   Next MD Visit  regular OB checkups    Prior Therapy  2-3 years ago OPPT       Precautions   Precautions  Other (comment)    Precaution Comments  pregnancy       [place] residence      Prior Function   Vocation  Full time employment    Vocation Requirements  pharmacist     Leisure  running atleast every other day on her 7 days  off work       Cognition   Overall Cognitive Status  Within Functional Limits for tasks assessed      Sensation   Additional Comments  denies numbness/tingling      Posture/Postural Control   Posture Comments  BLE squat below 90 without pain, (+) weight shift left noted at end of squat x5      AROM   Overall AROM Comments  Lumbar flexion and rotation within normal limits/pain free, lumbar extension limited 50% and pain end range      Strength   Overall Strength Comments  BLE 5/5 MMT except external rotation/internal rotation 4/5 MMT bilaterally      Palpation   Palpation comment  tenderness L3-L5, lumbar paraspinals/QL on Rt                    [facility] Adult PT Treatment/Exercise - 08/23/19 0001      Lumbar Exercises: Standing   Other Standing Lumbar Exercises  wall and counter plank with TrA x 10; with single arm reaches x 10 -  cues to keep pelvis neutral    Other Standing Lumbar Exercises  hip hinge with cane, then without cane and reaching down to lift      Lumbar Exercises: Seated   Other Seated Lumbar Exercises  posture and extension with ball behind back      Lumbar Exercises: Prone   Other Prone Lumbar Exercises  wall and counter plank      Manual Therapy   Soft tissue mobilization  sacral distraction and bilateral lumbar               PT Short Term Goals - 07/29/19 1740      PT SHORT TERM GOAL #1   Title  Pt will be independent with her initial HEP to increase body mechanics awareness and trunk strength.    Time  1    Period  Days    Status  Achieved    Target Date  07/29/19      PT SHORT TERM GOAL #2   Title  Pt will have improved trunk stability evident by her ability to complete quadruped UE/LE reach x10 reps each side without trunk rotation or hip drop compensation    Time  5    Period  Weeks    Status  New        PT Long Term Goals - 08/23/19 1540      PT LONG TERM GOAL #1   Title  Pt will report being able to lift her daughter  throughout the day without increase in Rt low back pain    Time  12    Period  Weeks    Status  On-going    Target Date  11/15/19      PT LONG TERM GOAL #2   Title  Pt will report atlteast 60% improvement in her initial back pain with daily activity and sleeping.    Baseline  couple hours    Time  12    Period  Weeks    Status  On-going    Target Date  11/15/19      PT LONG TERM GOAL #3   Title  Pt will be able to verbalize sleep position adjustments for comfort throughout the night.    Time  12    Period  Weeks    Status  On-going      PT LONG TERM GOAL #4   Title  Pt will have 5/5 MMT strength with hip internal and external rotation which will help with daily activity and caring for her daughter.    Time  12    Period  Weeks    Status  On-going    Target Date  11/15/19      PT LONG TERM GOAL #5   Title  sleep 6-8 hour sleep with pain decreased by 80%    Time  12    Period  Weeks    Status  On-going    Target Date  11/15/19            Plan - 08/23/19 1654    Clinical Impression Statement  Pt came back to clinic after several weeks working on HEP with no huge change in her pain.  Pt is also not able to run as much.  Pt demonstrates some core weakness.  She demonstrates reduced pain with back in neutral but fatigues quickly in this position.  Pt demonstrates weakness and impairments as mentioned above. She will benefit from skilled PT to continue to work towards   her functional goals as stated in order to maintain function throughout her pregnancy.    PT Treatment/Interventions  ADLs/Self Care Home Management;Cryotherapy;Moist Heat;Electrical Stimulation;Neuromuscular re-education;Therapeutic exercise;Therapeutic activities;Patient/family education;Manual techniques;Passive range of motion;Taping    PT Next Visit Plan  progress core, glute strength/hip rotation strength; possibly tape abdominals    PT Home Exercise Plan  [id number]    Consulted and Agree with Plan of Care   Patient       Patient will benefit from skilled therapeutic intervention in order to improve the following deficits and impairments:  Decreased activity tolerance, Decreased strength, Postural dysfunction, Increased muscle spasms, Improper body mechanics, Pain  Visit Diagnosis: Low back pain, unspecified back pain laterality, unspecified chronicity, unspecified whether sciatica present  Muscle weakness (generalized)  Abnormal posture     Problem List Patient Active Problem List   Diagnosis Date Noted  . Vitamin D deficiency 12/01/2018  . Hyperlipidemia 12/01/2018  . Lumbar back pain 12/19/2014  . Facet syndrome, lumbar 06/29/2014  . Allergic rhinitis 03/31/2014  . Back pain, thoracic 08/09/2012  . Physical exam 12/18/2011  . Acne 06/27/2011    Jakki L Desenglau, PT 08/23/2019, 5:00 PM  [facility] [address], [address] [place], [place], [address] Phone: [phone]   Fax:  [phone]  Name: Teresa Graves MRN: [id number] Date of Birth: 03/06/1986  PHYSICAL THERAPY DISCHARGE SUMMARY  Visits from Start of Care: 7  Current functional level related to goals / functional outcomes: See above   Remaining deficits: See above   Education / Equipment: HEP  Plan: Patient agrees to discharge.  Patient goals were not met. Patient is being discharged due to not returning since the last visit.  ?????     Jakki DeSenglau, PT 11/15/19 11:35 AM  

## 2019-09-14 ENCOUNTER — Ambulatory Visit: Payer: No Typology Code available for payment source | Admitting: Physical Therapy

## 2019-10-12 LAB — OB RESULTS CONSOLE GBS: GBS: POSITIVE

## 2019-10-25 ENCOUNTER — Telehealth (HOSPITAL_COMMUNITY): Payer: Self-pay | Admitting: *Deleted

## 2019-10-25 ENCOUNTER — Encounter (HOSPITAL_COMMUNITY): Payer: Self-pay | Admitting: *Deleted

## 2019-10-25 NOTE — Telephone Encounter (Signed)
Preadmission screen  

## 2019-10-27 ENCOUNTER — Telehealth (HOSPITAL_COMMUNITY): Payer: Self-pay | Admitting: *Deleted

## 2019-10-27 ENCOUNTER — Encounter (HOSPITAL_COMMUNITY): Payer: Self-pay | Admitting: *Deleted

## 2019-10-27 NOTE — Telephone Encounter (Signed)
Preadmission screen  

## 2019-11-03 ENCOUNTER — Other Ambulatory Visit (HOSPITAL_COMMUNITY): Payer: No Typology Code available for payment source

## 2019-11-04 ENCOUNTER — Other Ambulatory Visit: Payer: Self-pay | Admitting: Obstetrics and Gynecology

## 2019-11-04 ENCOUNTER — Other Ambulatory Visit (HOSPITAL_COMMUNITY)
Admission: RE | Admit: 2019-11-04 | Discharge: 2019-11-04 | Disposition: A | Discharge status: Home / Self Care | Payer: No Typology Code available for payment source | Source: Ambulatory Visit | Attending: Obstetrics and Gynecology | Admitting: Obstetrics and Gynecology

## 2019-11-04 LAB — SARS CORONAVIRUS 2 (TAT 6-24 HRS): SARS Coronavirus 2: NEGATIVE

## 2019-11-04 NOTE — H&P (Deleted)
  The note originally documented on this encounter has been moved the the encounter in which it belongs.  

## 2019-11-04 NOTE — H&P (Signed)
KAMORI KITCHENS is a 34 y.o. female G2P1001 at 39+ for IOL given favorable cervix, term gestation.  Pregnancy complicated by chronic back pain from herniated disc.  Received PT in pregnancy.  Also AMA - declined genetic screening.  Pt w history of LLP, resolved on repeat US.  D/W pt r/b/a of IOL and process.  Received Tdap in [facility].  Pregnancy dated by LMP cw early [place].    OB History    Gravida  2   Para  1   Term  1   Preterm      AB      Living  1     SAB      TAB      Ectopic      Multiple  0   Live Births  1         no abn pap, nl HR HPV neg w pregnancy No STD  G1 SVD 6#3 SVD 39wk G2 present  Past Medical History:  Diagnosis Date  . Lumbar herniated disc    Past Surgical History:  Procedure Laterality Date  . ear lobe repaired     Family History: family history includes Cancer in her father and maternal aunt; Hyperlipidemia in her father and mother; Hypertension in her mother; Kidney cancer in her mother; Thyroid disease in her mother. Social History:  reports that she has never smoked. She has never used smokeless tobacco. She reports current alcohol use. She reports that she does not use drugs. married, pharmacist  Meds Miralax, PNV, tylenol All NKDA     Maternal Diabetes: No Genetic Screening: Declined Maternal Ultrasounds/Referrals: Normal Fetal Ultrasounds or other Referrals:  None Maternal Substance Abuse:  No Significant Maternal Medications:  None Significant Maternal Lab Results:  Group B Strep positive Other Comments:  None  Review of Systems  Constitutional: Negative.   HENT: Negative.   Eyes: Negative.   Respiratory: Negative.   Cardiovascular: Negative.   Gastrointestinal: Negative.   Genitourinary: Negative.   Musculoskeletal: Positive for back pain.  Skin: Negative.   Neurological: Negative.   Psychiatric/Behavioral: Negative.    Maternal Medical History:  Contractions: Frequency: irregular.    Fetal activity: Perceived  fetal activity is normal.    Prenatal complications: Low-lying placenta - resolved  Prenatal Complications - Diabetes: none.      Last menstrual period 01/30/2019. Maternal Exam:  Uterine Assessment: Contraction frequency is irregular.   Abdomen: Patient reports no abdominal tenderness. Fundal height is appropriate for gestation.   Estimated fetal weight is 7-7.5#.   Fetal presentation: vertex  Introitus: Normal vulva. Normal vagina.    Physical Exam  Constitutional: She is oriented to person, place, and time. She appears well-developed and well-nourished.  HENT:  Head: Normocephalic and atraumatic.  Cardiovascular: Normal rate and regular rhythm.  Respiratory: Effort normal and breath sounds normal. No respiratory distress. She has no wheezes.  GI: Soft. Bowel sounds are normal. She exhibits no distension. There is no abdominal tenderness.  Genitourinary:    Vulva normal.   Musculoskeletal:        General: Normal range of motion.  Neurological: She is alert and oriented to person, place, and time.  Skin: Skin is warm and dry.  Psychiatric: She has a normal mood and affect. Her behavior is normal.    Prenatal labs: ABO, Rh: AB/Positive/-- (11/10 0000) Antibody: Negative (11/10 0000) Rubella: Immune (11/10 0000) RPR: Nonreactive (11/10 0000)  HBsAg: Negative (11/10 0000)  HIV: Non-reactive (11/10 0000)  GBS: Positive/-- (04/20  0000)  Dated by 11wk [place] cw LMP Re[facility] Declines genetic screening  Essential panel (CF, SMA and Fragile X neg), Hgb 13.7/Plt 208/Ur Cx neg/GC neg/Chl neg/Varicella immune/Hgb electro WNL/Pap WNL HR HPV neg/glucola 123/Hep C neg  [place] nl AFI, cwd, nl ant, ant plac - LLP, female F/u LLP resolved  COVID neg Assessment/Plan: 34yo G2P1001 at 39+ for IOL GBBS + - PCN for prophylaxis AROM after PCN Induce w pitocin and AROM Epidural/IV pain meds PRN Expect SVD   Sharnell Knight Bovard-Stuckert 11/04/2019, 9:38 PM

## 2019-11-05 ENCOUNTER — Inpatient Hospital Stay (HOSPITAL_COMMUNITY)
Admission: AD | Admit: 2019-11-05 | Discharge: 2019-11-07 | DRG: 798 | Disposition: A | Discharge status: Home / Self Care | Payer: No Typology Code available for payment source | Attending: Obstetrics and Gynecology | Admitting: Obstetrics and Gynecology

## 2019-11-05 ENCOUNTER — Other Ambulatory Visit: Payer: Self-pay

## 2019-11-05 ENCOUNTER — Encounter (HOSPITAL_COMMUNITY): Payer: Self-pay | Admitting: Obstetrics and Gynecology

## 2019-11-05 ENCOUNTER — Inpatient Hospital Stay (HOSPITAL_COMMUNITY): Payer: No Typology Code available for payment source | Admitting: Anesthesiology

## 2019-11-05 ENCOUNTER — Inpatient Hospital Stay (HOSPITAL_COMMUNITY): Payer: No Typology Code available for payment source

## 2019-11-05 DIAGNOSIS — O26893 Other specified pregnancy related conditions, third trimester: Secondary | ICD-10-CM | POA: Diagnosis present

## 2019-11-05 DIAGNOSIS — Z23 Encounter for immunization: Secondary | ICD-10-CM

## 2019-11-05 DIAGNOSIS — O43123 Velamentous insertion of umbilical cord, third trimester: Secondary | ICD-10-CM | POA: Diagnosis present

## 2019-11-05 DIAGNOSIS — M5126 Other intervertebral disc displacement, lumbar region: Secondary | ICD-10-CM | POA: Diagnosis present

## 2019-11-05 DIAGNOSIS — Z3A39 39 weeks gestation of pregnancy: Secondary | ICD-10-CM

## 2019-11-05 DIAGNOSIS — M549 Dorsalgia, unspecified: Secondary | ICD-10-CM | POA: Diagnosis present

## 2019-11-05 DIAGNOSIS — Z20822 Contact with and (suspected) exposure to covid-19: Secondary | ICD-10-CM | POA: Diagnosis present

## 2019-11-05 DIAGNOSIS — G8929 Other chronic pain: Secondary | ICD-10-CM | POA: Diagnosis present

## 2019-11-05 DIAGNOSIS — O99824 Streptococcus B carrier state complicating childbirth: Secondary | ICD-10-CM | POA: Diagnosis present

## 2019-11-05 DIAGNOSIS — Z3483 Encounter for supervision of other normal pregnancy, third trimester: Secondary | ICD-10-CM

## 2019-11-05 LAB — TYPE AND SCREEN
ABO/RH(D): AB POS
Antibody Screen: NEGATIVE

## 2019-11-05 LAB — CBC
HCT: 40.7 % (ref 36.0–46.0)
Hemoglobin: 13.9 g/dL (ref 12.0–15.0)
MCH: 31.4 pg (ref 26.0–34.0)
MCHC: 34.2 g/dL (ref 30.0–36.0)
MCV: 92.1 fL (ref 80.0–100.0)
Platelets: 159 10*3/uL (ref 150–400)
RBC: 4.42 MIL/uL (ref 3.87–5.11)
RDW: 12.8 % (ref 11.5–15.5)
WBC: 9.5 10*3/uL (ref 4.0–10.5)
nRBC: 0 % (ref 0.0–0.2)

## 2019-11-05 LAB — ABO/RH: ABO/RH(D): AB POS

## 2019-11-05 MED ORDER — FENTANYL-BUPIVACAINE-NACL 0.5-0.125-0.9 MG/250ML-% EP SOLN
12.0000 mL/h | EPIDURAL | Status: DC | PRN
  Filled 2019-11-05: qty 250

## 2019-11-05 MED ORDER — SODIUM CHLORIDE 0.9 % IV SOLN
5.0000 10*6.[IU] | Freq: Once | INTRAVENOUS | Status: AC
  Administered 2019-11-05: 5 10*6.[IU] via INTRAVENOUS
  Filled 2019-11-05: qty 5

## 2019-11-05 MED ORDER — TERBUTALINE SULFATE 1 MG/ML IJ SOLN: 0.2500 mg | Freq: Once | INTRAMUSCULAR | Status: DC | PRN

## 2019-11-05 MED ORDER — OXYTOCIN 40 UNITS IN NORMAL SALINE INFUSION - SIMPLE MED
1.0000 m[IU]/min | INTRAVENOUS | Status: DC
  Administered 2019-11-05: 2 m[IU]/min via INTRAVENOUS
  Filled 2019-11-05: qty 1000

## 2019-11-05 MED ORDER — DIPHENHYDRAMINE HCL 50 MG/ML IJ SOLN: 12.5000 mg | INTRAMUSCULAR | Status: DC | PRN

## 2019-11-05 MED ORDER — SODIUM CHLORIDE 0.9 % IV SOLN
2.0000 g | Freq: Two times a day (BID) | INTRAVENOUS | Status: AC
  Administered 2019-11-05 – 2019-11-06 (×2): 2 g via INTRAVENOUS
  Filled 2019-11-05 (×2): qty 2

## 2019-11-05 MED ORDER — ACETAMINOPHEN 325 MG PO TABS: 650.0000 mg | ORAL_TABLET | ORAL | Status: DC | PRN

## 2019-11-05 MED ORDER — ONDANSETRON HCL 4 MG/2ML IJ SOLN: 4.0000 mg | Freq: Four times a day (QID) | INTRAMUSCULAR | Status: DC | PRN

## 2019-11-05 MED ORDER — OXYCODONE-ACETAMINOPHEN 5-325 MG PO TABS: 1.0000 | ORAL_TABLET | ORAL | Status: DC | PRN

## 2019-11-05 MED ORDER — SODIUM CHLORIDE (PF) 0.9 % IJ SOLN
INTRAMUSCULAR | Status: DC | PRN
  Administered 2019-11-05: 12 mL/h via EPIDURAL

## 2019-11-05 MED ORDER — LACTATED RINGERS IV SOLN: INTRAVENOUS | Status: DC

## 2019-11-05 MED ORDER — BUTORPHANOL TARTRATE 1 MG/ML IJ SOLN: 1.0000 mg | INTRAMUSCULAR | Status: DC | PRN

## 2019-11-05 MED ORDER — LIDOCAINE HCL (PF) 1 % IJ SOLN
INTRAMUSCULAR | Status: DC | PRN
  Administered 2019-11-05 (×2): 5 mL via EPIDURAL

## 2019-11-05 MED ORDER — OXYTOCIN 40 UNITS IN NORMAL SALINE INFUSION - SIMPLE MED: 2.5000 [IU]/h | INTRAVENOUS | Status: DC

## 2019-11-05 MED ORDER — SOD CITRATE-CITRIC ACID 500-334 MG/5ML PO SOLN: 30.0000 mL | ORAL | Status: DC | PRN

## 2019-11-05 MED ORDER — EPHEDRINE 5 MG/ML INJ: 10.0000 mg | INTRAVENOUS | Status: DC | PRN

## 2019-11-05 MED ORDER — LACTATED RINGERS IV SOLN: 500.0000 mL | INTRAVENOUS | Status: DC | PRN

## 2019-11-05 MED ORDER — PHENYLEPHRINE 40 MCG/ML (10ML) SYRINGE FOR IV PUSH (FOR BLOOD PRESSURE SUPPORT): 80.0000 ug | PREFILLED_SYRINGE | INTRAVENOUS | Status: DC | PRN

## 2019-11-05 MED ORDER — OXYCODONE-ACETAMINOPHEN 5-325 MG PO TABS: 2.0000 | ORAL_TABLET | ORAL | Status: DC | PRN

## 2019-11-05 MED ORDER — PHENYLEPHRINE 40 MCG/ML (10ML) SYRINGE FOR IV PUSH (FOR BLOOD PRESSURE SUPPORT)
80.0000 ug | PREFILLED_SYRINGE | INTRAVENOUS | Status: DC | PRN
  Filled 2019-11-05: qty 10

## 2019-11-05 MED ORDER — OXYTOCIN BOLUS FROM INFUSION
500.0000 mL | Freq: Once | INTRAVENOUS | Status: AC
  Administered 2019-11-05: 500 mL via INTRAVENOUS

## 2019-11-05 MED ORDER — LACTATED RINGERS IV SOLN: 500.0000 mL | Freq: Once | INTRAVENOUS | Status: DC

## 2019-11-05 MED ORDER — LIDOCAINE HCL (PF) 1 % IJ SOLN: 30.0000 mL | INTRAMUSCULAR | Status: DC | PRN

## 2019-11-05 MED ORDER — PENICILLIN G POT IN DEXTROSE 60000 UNIT/ML IV SOLN
3.0000 10*6.[IU] | INTRAVENOUS | Status: DC
  Administered 2019-11-05 (×2): 3 10*6.[IU] via INTRAVENOUS
  Filled 2019-11-05 (×2): qty 50

## 2019-11-05 NOTE — Anesthesia Procedure Notes (Signed)
Epidural Patient location during procedure: OB Start time: 11/05/2019 5:51 PM End time: 11/05/2019 6:03 PM  Staffing Anesthesiologist: Lucretia Kern, MD Performed: anesthesiologist   Preanesthetic Checklist Completed: patient identified, IV checked, risks and benefits discussed, monitors and equipment checked, pre-op evaluation and timeout performed  Epidural Patient position: sitting Prep: DuraPrep Patient monitoring: heart rate, continuous pulse ox and blood pressure Approach: midline Location: L3-L4 Injection technique: LOR air  Needle:  Needle type: Tuohy  Needle gauge: 17 G Needle length: 9 cm Needle insertion depth: 6 cm Catheter type: closed end flexible Catheter size: 19 Gauge Catheter at skin depth: 11 cm Test dose: negative  Assessment Events: blood not aspirated, injection not painful, no injection resistance, no paresthesia and negative IV test  Additional Notes Reason for block:procedure for pain

## 2019-11-05 NOTE — Anesthesia Preprocedure Evaluation (Signed)
Anesthesia Evaluation  Patient identified by MRN, date of birth, ID band Patient awake    Reviewed: Allergy & Precautions, H&P , NPO status , Patient's Chart, lab work & pertinent test results  History of Anesthesia Complications Negative for: history of anesthetic complications  Airway Mallampati: II  TM Distance: >3 FB Neck ROM: full    Dental no notable dental hx.    Pulmonary neg pulmonary ROS,    Pulmonary exam normal        Cardiovascular negative cardio ROS Normal cardiovascular exam Rhythm:regular Rate:Normal     Neuro/Psych L5-S1 herniated disc negative psych ROS   GI/Hepatic negative GI ROS, Neg liver ROS,   Endo/Other  negative endocrine ROS  Renal/GU negative Renal ROS  negative genitourinary   Musculoskeletal   Abdominal   Peds  Hematology negative hematology ROS (+)   Anesthesia Other Findings   Reproductive/Obstetrics (+) Pregnancy                             Anesthesia Physical Anesthesia Plan  ASA: II  Anesthesia Plan: Epidural   Post-op Pain Management:    Induction:   PONV Risk Score and Plan:   Airway Management Planned:   Additional Equipment:   Intra-op Plan:   Post-operative Plan:   Informed Consent: I have reviewed the patients History and Physical, chart, labs and discussed the procedure including the risks, benefits and alternatives for the proposed anesthesia with the patient or authorized representative who has indicated his/her understanding and acceptance.       Plan Discussed with:   Anesthesia Plan Comments:         Anesthesia Quick Evaluation

## 2019-11-05 NOTE — Progress Notes (Addendum)
Patient ID: Teresa Graves, female   DOB: 08/26/1985, 34 y.o.   MRN: [id number]  IOL elective at term  No c/o's.  Comfortable w epidura;  AFVSS gen NAD FHTs 130 - 150's, mod var, + accels, some early/variable decels,category 1 toco Q 2-29min  SVE 4.5/80/-2  IUPC placed w/o diff/comp.    Continue IOL Expect SVD

## 2019-11-06 ENCOUNTER — Encounter (HOSPITAL_COMMUNITY): Payer: Self-pay | Admitting: Obstetrics and Gynecology

## 2019-11-06 LAB — CBC
HCT: 37.6 % (ref 36.0–46.0)
Hemoglobin: 12.9 g/dL (ref 12.0–15.0)
MCH: 32 pg (ref 26.0–34.0)
MCHC: 34.3 g/dL (ref 30.0–36.0)
MCV: 93.3 fL (ref 80.0–100.0)
Platelets: 147 10*3/uL — ABNORMAL LOW (ref 150–400)
RBC: 4.03 MIL/uL (ref 3.87–5.11)
RDW: 12.8 % (ref 11.5–15.5)
WBC: 16.6 10*3/uL — ABNORMAL HIGH (ref 4.0–10.5)
nRBC: 0 % (ref 0.0–0.2)

## 2019-11-06 LAB — RPR: RPR Ser Ql: NONREACTIVE

## 2019-11-06 MED ORDER — ACETAMINOPHEN 325 MG PO TABS
650.0000 mg | ORAL_TABLET | ORAL | Status: DC | PRN
  Administered 2019-11-06 (×2): 650 mg via ORAL
  Filled 2019-11-06 (×2): qty 2

## 2019-11-06 MED ORDER — TETANUS-DIPHTH-ACELL PERTUSSIS 5-2.5-18.5 LF-MCG/0.5 IM SUSP: 0.5000 mL | Freq: Once | INTRAMUSCULAR | Status: DC

## 2019-11-06 MED ORDER — WITCH HAZEL-GLYCERIN EX PADS
1.0000 "application " | MEDICATED_PAD | CUTANEOUS | Status: DC | PRN
  Administered 2019-11-06: 1 via TOPICAL

## 2019-11-06 MED ORDER — ZOLPIDEM TARTRATE 5 MG PO TABS: 5.0000 mg | ORAL_TABLET | Freq: Every evening | ORAL | Status: DC | PRN

## 2019-11-06 MED ORDER — DIBUCAINE (PERIANAL) 1 % EX OINT: 1.0000 "application " | TOPICAL_OINTMENT | CUTANEOUS | Status: DC | PRN

## 2019-11-06 MED ORDER — ONDANSETRON HCL 4 MG/2ML IJ SOLN: 4.0000 mg | INTRAMUSCULAR | Status: DC | PRN

## 2019-11-06 MED ORDER — LACTATED RINGERS IV SOLN: INTRAVENOUS | Status: DC

## 2019-11-06 MED ORDER — SENNOSIDES-DOCUSATE SODIUM 8.6-50 MG PO TABS
2.0000 | ORAL_TABLET | ORAL | Status: DC
  Administered 2019-11-07: 2 via ORAL
  Filled 2019-11-06: qty 2

## 2019-11-06 MED ORDER — IBUPROFEN 600 MG PO TABS
600.0000 mg | ORAL_TABLET | Freq: Four times a day (QID) | ORAL | Status: DC
  Administered 2019-11-06 – 2019-11-07 (×6): 600 mg via ORAL
  Filled 2019-11-06 (×6): qty 1

## 2019-11-06 MED ORDER — OXYCODONE HCL 5 MG PO TABS: 10.0000 mg | ORAL_TABLET | ORAL | Status: DC | PRN

## 2019-11-06 MED ORDER — BENZOCAINE-MENTHOL 20-0.5 % EX AERO
1.0000 "application " | INHALATION_SPRAY | CUTANEOUS | Status: DC | PRN
  Administered 2019-11-06: 1 via TOPICAL
  Filled 2019-11-06: qty 56

## 2019-11-06 MED ORDER — PRENATAL MULTIVITAMIN CH: 1.0000 | ORAL_TABLET | Freq: Every day | ORAL | Status: DC

## 2019-11-06 MED ORDER — OXYCODONE HCL 5 MG PO TABS: 5.0000 mg | ORAL_TABLET | ORAL | Status: DC | PRN

## 2019-11-06 MED ORDER — ONDANSETRON HCL 4 MG PO TABS: 4.0000 mg | ORAL_TABLET | ORAL | Status: DC | PRN

## 2019-11-06 MED ORDER — PRENATAL MULTIVITAMIN CH
1.0000 | ORAL_TABLET | Freq: Every day | ORAL | Status: DC
  Administered 2019-11-06 – 2019-11-07 (×2): 1 via ORAL
  Filled 2019-11-06 (×2): qty 1

## 2019-11-06 MED ORDER — COCONUT OIL OIL
1.0000 "application " | TOPICAL_OIL | Status: DC | PRN
  Administered 2019-11-06: 1 via TOPICAL

## 2019-11-06 MED ORDER — DIPHENHYDRAMINE HCL 25 MG PO CAPS: 25.0000 mg | ORAL_CAPSULE | Freq: Four times a day (QID) | ORAL | Status: DC | PRN

## 2019-11-06 MED ORDER — SIMETHICONE 80 MG PO CHEW: 80.0000 mg | CHEWABLE_TABLET | ORAL | Status: DC | PRN

## 2019-11-06 MED ORDER — PNEUMOCOCCAL VAC POLYVALENT 25 MCG/0.5ML IJ INJ
0.5000 mL | INJECTION | Freq: Once | INTRAMUSCULAR | Status: AC
  Administered 2019-11-06: 0.5 mL via INTRAMUSCULAR
  Filled 2019-11-06: qty 0.5

## 2019-11-06 NOTE — Lactation Note (Signed)
This note was copied from a baby's chart. Lactation Consultation Note  Patient Name: Teresa Graves [id number] Date: 11/06/2019  P2, 23 hour term female infant. LC entered room, mom and infant asleep at this time.   Maternal Data    Feeding Feeding Type: Breast Fed  LATCH Score                   Interventions    Lactation Tools Discussed/Used     Consult Status      Danelle Earthly 11/06/2019, 10:40 PM

## 2019-11-06 NOTE — Lactation Note (Signed)
This note was copied from a baby's chart. Lactation Consultation Note  Patient Name: Teresa Graves [id number] Date: 11/06/2019 Reason for consult: Initial assessment;Term P2, 4 hour term female infant. Per mom, she breastfed her 36 year old son for one year but breastfeeding was painful. Per mom, she has ordered DEBP with her OB through her insurance.  Per mom, infant latched 30 minutes prior to [facility] entering room and infant BF for 27 minutes. LC notice mom had abrasion on her right breast and she complained of pain with latch. Mom latched infant on right breast using football hold position, infant latched with wide mouth, nose and chin touching only suckled few times not interested in feeding, per mom she felt no pain and could tell difference with latch. Mom previously breastfed using the cradle hold position without pillow support. Mom was give comfort gels to wear in bra to help with healing and understand to apply EBM to breast. Mom will continue to work on latching infant at breast and will ask RN or LC for assistance with latching infant at breast if needed. Mom will breastfed infant according to hunger cues, 8 to 12+ times on demand and not exceed 3 hours without breastfeeding infant. Reviewed Baby & Me book's Breastfeeding Basics.  Mom made aware of O/P services, breastfeeding support groups, community resources, and our phone # for post-discharge questions.   Maternal Data Formula Feeding for Exclusion: Yes Reason for exclusion: Mother's choice to formula and breast feed on admission Has patient been taught Hand Expression?: Yes Does the patient have breastfeeding experience prior to this delivery?: Yes  Feeding Feeding Type: Breast Fed  LATCH Score Latch: Repeated attempts needed to sustain latch, nipple held in mouth throughout feeding, stimulation needed to elicit sucking reflex.  Audible Swallowing: A few with stimulation  Type of Nipple: Everted at rest and after  stimulation  Comfort (Breast/Nipple): Soft / non-tender  Hold (Positioning): No assistance needed to correctly position infant at breast.  LATCH Score: 8  Interventions Interventions: Breast feeding basics reviewed;Skin to skin;Hand express;Support pillows;Adjust position;Breast compression;Position options  Lactation Tools Discussed/Used WIC Program: No   Consult Status Consult Status: Follow-up Date: 11/06/19 Follow-up type: In-patient    Danelle Earthly 11/06/2019, 3:02 AM

## 2019-11-06 NOTE — Anesthesia Postprocedure Evaluation (Signed)
Anesthesia Post Note  Patient: Teresa Graves  Procedure(s) Performed: AN AD HOC LABOR EPIDURAL     Patient location during evaluation: Mother Baby Anesthesia Type: Epidural Level of consciousness: awake Pain management: satisfactory to patient Vital Signs Assessment: post-procedure vital signs reviewed and stable Respiratory status: spontaneous breathing Cardiovascular status: stable Anesthetic complications: no    Last Vitals:  Vitals:   11/06/19 0214 11/06/19 0623  BP: 130/89 111/75  Pulse: 82 64  Resp: 18 18  Temp:  36.6 C  SpO2: 99% 98%    Last Pain:  Vitals:   11/06/19 0623  TempSrc: Axillary  PainSc: 4    Pain Goal:                   [employer]

## 2019-11-06 NOTE — Lactation Note (Signed)
This note was copied from a baby's chart. Lactation Consultation Note  Patient Name: Teresa Graves Pro [id number]'L Date: 11/06/2019 Reason for consult: Follow-up assessment;Nipple pain/trauma Baby is 15 hours old/1% weight loss.  Baby has had 6 feedings since birth.  Mom is concerned about nipple pain with feedings.  She states she is using both the cross cradle and football hold.  Baby is currently sleeping on mom's chest.  Offered assist when baby is ready to feed.  Mom will call for next feeding.  Maternal Data    Feeding Feeding Type: Breast Fed  LATCH Score                   Interventions    Lactation Tools Discussed/Used     Consult Status Consult Status: Follow-up Date: 11/07/19 Follow-up type: In-patient    Huston Foley 11/06/2019, 1:58 PM

## 2019-11-06 NOTE — Plan of Care (Signed)
  Problem: Skin Integrity: Goal: Risk for impaired skin integrity will decrease Note: Patient's nipples are sore; no blisters or bruising yet. Infant latched well. Patient has coconut oil and comfort gels. Earl Gala, Linda Hedges Merlin

## 2019-11-06 NOTE — Progress Notes (Signed)
Post Partum Day 1 Subjective: no complaints, up ad lib, voiding, tolerating PO and nl lochia, pain controlled  Objective: Blood pressure 111/75, pulse 64, temperature 97.9 F (36.6 C), temperature source Axillary, resp. rate 18, weight 98.2 kg, last menstrual period 01/30/2019, SpO2 98 %, unknown if currently breastfeeding.  Physical Exam:  General: alert and no distress Lochia: appropriate Uterine Fundus: firm   Recent Labs    11/05/19 1100 11/06/19 0517  HGB 13.9 12.9  HCT 40.7 37.6    Assessment/Plan: Plan for discharge tomorrow, Breastfeeding and Lactation consult.  Routine care.     LOS: 1 day   Teresa Graves 11/06/2019, 10:41 AM

## 2019-11-06 NOTE — Lactation Note (Signed)
This note was copied from a baby's chart. Lactation Consultation Note  Patient Name: Teresa Graves EUMPN'T Date: 11/06/2019 Reason for consult: Follow-up assessment;Nipple pain/trauma RN reports that mom wants to see lactation.  Mom c/o painful abraded,red and bruised nipples.  Mom reports she feels like that she is breastfeeding well but nipples still painful. Mom using shells and coconut oil.  Urged to always hand express first and rub Expressed mothers milk on nipples m and air dry prior to putting anything else.  Infant sleepy at this time.  Discussed laid back breastfeeding. Asked mom to lay back and attempt it.  Infant woke some and opened to latch.  Mom reports too uncomfortable.  Unable to tolerate the latch. Mom very tense before even started.  Reports knows it is going to hurt.  Assist again, same thing, urged mom break suction and take her off.  Discussed trying a nipple shield.  Mom reports she will try anything to make her feel better.  Assist with 24 mm nipple shield/.  Nipple almost comes to the tip of the shield. Assisted mom in latching.  Infant not really interested and when does try to latch purses lips.  Urged mom to break suction and keep STS.  A[plogized for not being able to make her comfortable.  Discussed pumping/ hand expression if nipples too sore to latch.  Discussed hand express prior and after and rum expressed mothers milk and air dry  nipples air dry.  Call lactation as needed.  RN to talk to [facility] regarding moms sore nipples.   Maternal Data    Feeding    LATCH Score                   Interventions    Lactation Tools Discussed/Used     Consult Status Consult Status: Follow-up Date: 11/07/19 Follow-up type: In-patient    Teresa Graves Michaelle Copas 11/06/2019, 4:55 PM

## 2019-11-07 MED ORDER — PRENATAL MULTIVITAMIN CH: 1.0000 | ORAL_TABLET | Freq: Every day | ORAL | 3 refills | Status: DC

## 2019-11-07 MED ORDER — IBUPROFEN 600 MG PO TABS: 600.0000 mg | ORAL_TABLET | Freq: Four times a day (QID) | ORAL | 0 refills | Status: DC

## 2019-11-07 NOTE — Lactation Note (Signed)
This note was copied from a baby's chart. Lactation Consultation Note  Patient Name: Teresa Graves Date: 11/07/2019 Reason for consult: Follow-up assessment  P2 mother whose infant is now 35 hours old.  This is a term baby at 39+6 weeks.  Mother breast fed her first child (now 36 years old) for one year.  Mother's main concern is nipple sensitivity with breast feeding.  Her nipples are pink and irritated and left nipple has a small blister.  Mother feels sensitivity primarily with the initial latch which improves as baby feeds.  She has been using EBM, coconut oil and has recently been provided comfort gels.  I gave mother breast shells to keep her nipples from rubbing on her clothing in between feedings.  Discussed ways to minimize nipple tenderness and techniques to make latching easier.  Mother has found the most success and comfort with sitting upright in her chair rather than her bed.  Due to sensitivity I explained that mother can let her left nipple rest for a few feedings and pump only if the sensitivity is too great to latch. Discussed the option of [facility] with a prescription as desired.  Mother verbalized understanding.  Mother has a manual pump and a DEBP for home use.  Engorgement prevention/treatment reviewed.  Father present.  Mother has our OP phone number for questions after discharge.     Maternal Data    Feeding    LATCH Score                   Interventions    Lactation Tools Discussed/Used     Consult Status Consult Status: Complete Date: 11/07/19 Follow-up type: Call as needed    Teresa Graves 11/07/2019, 9:33 AM

## 2019-11-07 NOTE — Progress Notes (Signed)
Post Partum Day 2 Subjective: no complaints, up ad lib, voiding, tolerating PO and nl lochia, pain controlled  Objective: Blood pressure 117/78, pulse 63, temperature 97.8 F (36.6 C), temperature source Oral, resp. rate 18, weight 98.2 kg, last menstrual period 01/30/2019, SpO2 99 %, unknown if currently breastfeeding.  Physical Exam:  General: alert and no distress Lochia: appropriate Uterine Fundus: firm   Recent Labs    11/05/19 1100 11/06/19 0517  HGB 13.9 12.9  HCT 40.7 37.6    Assessment/Plan: Discharge home, Breastfeeding and Lactation consult, Routine postpartum care.  D/C with motrin and PNV.  F/u 6wks.   LOS: 2 days   Jody Bovard-Stuckert 11/07/2019, 7:31 AM

## 2019-11-07 NOTE — Discharge Summary (Signed)
Postpartum Discharge Summary       Patient Name: Teresa Graves DOB: 1986/02/27 MRN: [id number]  Date of admission: 11/05/2019 Delivery date:11/05/2019  Delivering provider: Janyth Contes  Date of discharge: 11/07/2019  Admitting diagnosis: Normal pregnancy in multigravida in third trimester [Z34.83] Intrauterine pregnancy: [redacted]w[redacted]d    Secondary diagnosis:  Principal Problem:   SVD (spontaneous vaginal delivery) Active Problems:   Normal pregnancy in multigravida in third trimester  Additional problems: N/A    Discharge diagnosis: Term Pregnancy Delivered                                              Post partum procedures:N/A Augmentation: AROM and Pitocin Complications: None  Hospital course: Induction of Labor With Vaginal Delivery   34y.o. yo G2P2002 at 36 1w6das admitted to the hospital 11/05/2019 for induction of labor.  Indication for induction: Favorable cervix at term.  Patient had an uncomplicated labor course as follows: Membrane Rupture Time/Date: 3:15 PM ,11/05/2019   Delivery Method:Vaginal, Spontaneous  Episiotomy: None  Lacerations:  1st degree;Vaginal  Details of delivery can be found in separate delivery note.  Patient had a routine postpartum course. Patient is discharged home 11/07/19.  Newborn Data: Birth date:11/05/2019  Birth time:10:54 PM  Gender:Female  Living status:Living  Apgars:9 ,9   Magnesium Sulfate received: No BMZ received: No Rhophylac:No MMR:No T-DaP:Given prenatally Flu: No Transfusion:No  Physical exam  Vitals:   11/06/19 1045 11/06/19 1340 11/06/19 1915 11/07/19 0544  BP: 114/80 110/76 119/74 117/78  Pulse: 61 73 65 63  Resp: 18 16 18 18   Temp: 98.2 F (36.8 C) 97.9 F (36.6 C) 98.2 F (36.8 C) 97.8 F (36.6 C)  TempSrc: Axillary Axillary Oral Oral  SpO2: 97% 100% 99% 99%  Weight:       General: alert and no distress Lochia: appropriate Uterine Fundus: firm Labs: Lab Results  Component Value Date   WBC 16.6 (H) 11/06/2019   HGB 12.9 11/06/2019   HCT 37.6 11/06/2019   MCV 93.3 11/06/2019   PLT 147 (L) 11/06/2019   CMP Latest Ref Rng & Units 12/01/2018  Glucose 70 - 99 mg/dL 80  BUN 6 - 23 mg/dL 11  Creatinine 0.40 - 1.20 mg/dL 0.69  Sodium 135 - 145 mEq/L 140  Potassium 3.5 - 5.1 mEq/L 3.8  Chloride 96 - 112 mEq/L 105  CO2 19 - 32 mEq/L 26  Calcium 8.4 - 10.5 mg/dL 9.7  Total Protein 6.0 - 8.3 g/dL 7.1  Total Bilirubin 0.2 - 1.2 mg/dL 1.0  Alkaline Phos 39 - 117 U/L 52  AST 0 - 37 U/L 21  ALT 0 - 35 U/L 13   Edinburgh Score: Edinburgh Postnatal Depression Scale Screening Tool 11/07/2019  I have been able to laugh and see the funny side of things. 0  I have looked forward with enjoyment to things. 0  I have blamed myself unnecessarily when things went wrong. 1  I have been anxious or worried for no good reason. 0  I have felt scared or panicky for no good reason. 0  Things have been getting on top of me. 1  I have been so unhappy that I have had difficulty sleeping. 0  I have felt sad or miserable. 0  I have been so unhappy that I have been crying. 0  The thought of harming  myself has occurred to me. 0  Edinburgh Postnatal Depression Scale Total 2      After visit meds:  Allergies as of 11/07/2019      Reactions   Doxycycline    Major vomiting      Medication List    TAKE these medications   ibuprofen 600 MG tablet Commonly known as: ADVIL Take 1 tablet (600 mg total) by mouth every 6 (six) hours.   MiraLax 17 g packet Generic drug: polyethylene glycol Miralax  PRN   prenatal multivitamin Tabs tablet Take 1 tablet by mouth daily at 12 noon.   TYLENOL 500 MG tablet Generic drug: acetaminophen Tylenol  PRN        Discharge home in stable condition Infant Feeding: Breast Infant Disposition:home with mother Discharge instruction: per After Visit Summary and Postpartum booklet. Activity: Advance as tolerated. Pelvic rest for 6 weeks.  Diet: routine  diet Anticipated Birth Control: IUD Postpartum Appointment:6 weeks Additional Postpartum F/U: N/A Future Appointments:No future appointments. Follow up Visit: Follow-up Information    Bovard-Stuckert, Flornce Record, MD Follow up in 6 week(s).   Specialty: Obstetrics and Gynecology Why: postpartum check Contact information: [place] [address] [phone]               11/07/2019 Janyth Contes, MD

## 2019-11-08 MED FILL — IBUPROFEN 600 MG TABLET: 600 | 7 days supply | Qty: 30 | Fill #0

## 2020-05-10 ENCOUNTER — Telehealth (INDEPENDENT_AMBULATORY_CARE_PROVIDER_SITE_OTHER): Payer: No Typology Code available for payment source | Admitting: Family Medicine

## 2020-05-10 ENCOUNTER — Encounter: Payer: Self-pay | Admitting: Family Medicine

## 2020-05-10 DIAGNOSIS — J069 Acute upper respiratory infection, unspecified: Secondary | ICD-10-CM

## 2020-05-10 MED ORDER — AMOXICILLIN 875 MG PO TABS
875.0000 mg | ORAL_TABLET | Freq: Two times a day (BID) | ORAL | 0 refills | Status: DC
Start: 2020-05-10 — End: 2021-07-27

## 2020-05-10 MED FILL — AMOXICILLIN 875 MG TABS: 875 | 10 days supply | Qty: 20 | Fill #0

## 2020-05-10 NOTE — Progress Notes (Signed)
° °  Virtual Visit via Video   I connected with patient on 05/10/20 at  2:30 PM EST by a video enabled telemedicine application and verified that I am speaking with the correct person using two identifiers.  Location patient: Home Location provider: [place] [facility], Office Persons participating in the virtual visit: Patient, Provider, CMA (Sabrina M)  I discussed the limitations of evaluation and management by telemedicine and the availability of in person appointments. The patient expressed understanding and agreed to proceed.  Subjective:   HPI:   URI- 'pretty sure I picked up daycare crud'.  + productive cough, runny nose.  COVID test on Monday negative.  No fever.  Pt thought she was developing ear/jaw pain but this seemingly has improved.  Denies facial pain, minimal HA.  + sick contacts.  Still breast feeding.  Sxs started ~5 days ago.  ROS:   See pertinent positives and negatives per HPI.  Patient Active Problem List   Diagnosis Date Noted   SVD (spontaneous vaginal delivery) 11/06/2019   Normal pregnancy in multigravida in third trimester 11/05/2019   Vitamin D deficiency 12/01/2018   Hyperlipidemia 12/01/2018   Lumbar back pain 12/19/2014   Facet syndrome, lumbar 06/29/2014   Allergic rhinitis 03/31/2014   Back pain, thoracic 08/09/2012   Physical exam 12/18/2011   Acne 06/27/2011    Social History   Tobacco Use   Smoking status: Never Smoker   Smokeless tobacco: Never Used  Substance Use Topics   Alcohol use: Yes    Comment: socially    Current Outpatient Medications:    acetaminophen (TYLENOL) 500 MG tablet, Tylenol  PRN, Disp: , Rfl:    ibuprofen (ADVIL) 600 MG tablet, Take 1 tablet (600 mg total) by mouth every 6 (six) hours., Disp: 30 tablet, Rfl: 0   levonorgestrel (MIRENA, 52 MG,) 20 MCG/24HR IUD, Mirena 20 mcg/24 hours (7 yrs) 52 mg intrauterine device  Take 1 device by intrauterine route., Disp: , Rfl:    Prenatal Vit-Fe  Fumarate-FA (PRENATAL MULTIVITAMIN) TABS tablet, Take 1 tablet by mouth daily at 12 noon., Disp: 100 tablet, Rfl: 3   polyethylene glycol (MIRALAX) 17 g packet, Miralax  PRN (Patient not taking: Reported on 05/10/2020), Disp: , Rfl:   Allergies  Allergen Reactions   Doxycycline     Major vomiting     Objective:   There were no vitals taken for this visit. AAOx3, NAD NCAT, EOMI Pt is able to speak clearly, coherently without shortness of breath or increased work of breathing. + hacking cough  Thought process is linear.  Mood is appropriate.   Assessment and Plan:   URI- new.  Likely viral as her daughter's are in daycare and they have had respiratory sxs.  COVID test was negative.  She had some ear and jaw pain yesterday but this has improved- which could mean things are getting better or she may be heading into her 2nd sickening.  Since she is leaving for [place] tomorrow, will send in abx for her to have on hand if needed.  Pt expressed understanding and is in agreement w/ plan.   Neena Rhymes, MD 05/10/2020

## 2020-05-10 NOTE — Progress Notes (Signed)
I connected with  Teresa Graves on 05/10/20 by a video enabled telemedicine application and verified that I am speaking with the correct person using two identifiers.   I discussed the limitations of evaluation and management by telemedicine. The patient expressed understanding and agreed to proceed.

## 2020-05-26 ENCOUNTER — Telehealth: Payer: No Typology Code available for payment source | Admitting: Family Medicine

## 2020-06-20 ENCOUNTER — Encounter: Payer: Self-pay | Admitting: Family Medicine

## 2020-06-21 ENCOUNTER — Other Ambulatory Visit: Payer: Self-pay

## 2020-06-21 DIAGNOSIS — L600 Ingrowing nail: Secondary | ICD-10-CM

## 2020-06-30 ENCOUNTER — Ambulatory Visit: Payer: No Typology Code available for payment source | Admitting: Podiatry

## 2020-06-30 ENCOUNTER — Other Ambulatory Visit: Payer: Self-pay

## 2020-06-30 ENCOUNTER — Other Ambulatory Visit (HOSPITAL_COMMUNITY): Payer: Self-pay | Admitting: Podiatry

## 2020-06-30 DIAGNOSIS — L600 Ingrowing nail: Secondary | ICD-10-CM

## 2020-06-30 MED ORDER — NEOMYCIN-POLYMYXIN-HC 3.5-10000-1 OT SOLN: OTIC | 1 refills | Status: DC

## 2020-06-30 MED FILL — NEO/POLYMYXIN/HC EAR SOLN: 3.5-10000-1 | 50 days supply | Qty: 10 | Fill #0

## 2020-06-30 NOTE — Patient Instructions (Signed)

## 2020-07-03 NOTE — Progress Notes (Signed)
Subjective:   Patient ID: Teresa Graves, female   DOB: 35 y.o.   MRN: [id number]   HPI Patient presents stating she has had chronic ingrown toenails on both her big toes that are been going on for years.  States the left hurts more than the right but they are both quite sore and she has tried different soaking and trimming techniques without relief.  Patient does not smoke likes to be active   Review of Systems  All other systems reviewed and are negative.       Objective:  Physical Exam Vitals and nursing note reviewed.  Constitutional:      Appearance: She is well-developed and well-nourished.  Cardiovascular:     Pulses: Intact distal pulses.  Pulmonary:     Effort: Pulmonary effort is normal.  Musculoskeletal:        General: Normal range of motion.  Skin:    General: Skin is warm.  Neurological:     Mental Status: She is alert.     Neurovascular status was found to be intact with patient noted to have good digital perfusion and well-oriented.  She is found to have incurvated hallux nails bilateral both medial and lateral borders that are painful when pressed and make shoe gear difficult with slight redness no active drainage and quite a bit of pain with palpation.     Assessment:  Chronic ingrown toenail deformity hallux bilateral medial lateral borders     Plan:  H&P reviewed condition recommended correction.  I explained procedure risk and patient signed consent form after review and today I infiltrated each hallux 60 mg like Marcaine mixture sterile prep done and using sterile instrumentation remove the medial lateral borders exposed matrix applied phenol 3 applications 30 seconds followed by alcohol lavage sterile dressing to each border and instructed on leaving dressings on 24 hours but taking them off earlier if any throbbing were to occur and wrote prescription for Corticosporin otic solution.  Encouraged to call with questions or concerns and dispensed  surgical shoe so she can go straight to work

## 2020-12-20 ENCOUNTER — Encounter: Payer: Self-pay | Admitting: *Deleted

## 2021-01-16 ENCOUNTER — Other Ambulatory Visit (HOSPITAL_COMMUNITY): Payer: Self-pay

## 2021-01-16 MED ORDER — CARESTART COVID-19 HOME TEST VI KIT
PACK | 0 refills | Status: DC
  Filled 2021-01-16: qty 4, 4d supply, fill #0

## 2021-06-07 ENCOUNTER — Other Ambulatory Visit (HOSPITAL_COMMUNITY): Payer: Self-pay

## 2021-06-07 MED ORDER — CARESTART COVID-19 HOME TEST VI KIT
PACK | 0 refills | Status: DC
  Filled 2021-06-07: qty 4, 4d supply, fill #0

## 2021-06-11 ENCOUNTER — Other Ambulatory Visit (HOSPITAL_BASED_OUTPATIENT_CLINIC_OR_DEPARTMENT_OTHER): Payer: Self-pay

## 2021-06-11 ENCOUNTER — Encounter: Payer: Self-pay | Admitting: Registered Nurse

## 2021-06-11 ENCOUNTER — Other Ambulatory Visit (HOSPITAL_COMMUNITY): Payer: Self-pay

## 2021-06-11 ENCOUNTER — Ambulatory Visit (INDEPENDENT_AMBULATORY_CARE_PROVIDER_SITE_OTHER): Payer: No Typology Code available for payment source | Admitting: Registered Nurse

## 2021-06-11 VITALS — BP 113/59 | HR 65 | Temp 98.0°F | Resp 18 | Ht 68.0 in | Wt 196.0 lb

## 2021-06-11 DIAGNOSIS — J069 Acute upper respiratory infection, unspecified: Secondary | ICD-10-CM

## 2021-06-11 MED ORDER — AZELASTINE HCL 0.1 % NA SOLN
1.0000 | Freq: Two times a day (BID) | NASAL | 12 refills | Status: DC
  Filled 2021-06-11: qty 30, 90d supply, fill #0

## 2021-06-11 MED ORDER — FLUTICASONE PROPIONATE 50 MCG/ACT NA SUSP
2.0000 | Freq: Every day | NASAL | 6 refills | Status: DC
  Filled 2021-06-11: qty 16, fill #0

## 2021-06-11 MED ORDER — AZELASTINE HCL 0.1 % NA SOLN
1.0000 | Freq: Two times a day (BID) | NASAL | 12 refills | Status: DC
  Filled 2021-06-11: qty 30, 100d supply, fill #0

## 2021-06-11 MED ORDER — DM-GUAIFENESIN ER 30-600 MG PO TB12
1.0000 | ORAL_TABLET | Freq: Two times a day (BID) | ORAL | 0 refills | Status: DC
  Filled 2021-06-11: qty 20, 10d supply, fill #0

## 2021-06-11 MED ORDER — AMOXICILLIN-POT CLAVULANATE 875-125 MG PO TABS
1.0000 | ORAL_TABLET | Freq: Two times a day (BID) | ORAL | 0 refills | Status: DC
  Filled 2021-06-11: qty 20, 10d supply, fill #0

## 2021-06-11 MED ORDER — FLUTICASONE PROPIONATE 50 MCG/ACT NA SUSP
2.0000 | Freq: Every day | NASAL | 6 refills | Status: DC
  Filled 2021-06-11: qty 16, 30d supply, fill #0

## 2021-06-11 NOTE — Patient Instructions (Addendum)
Ms. Teresa Graves to meet you  I recommend augmentin po bid x 10 days - reasonable to hold on this for a day or two to see if you can get ahead of it.  I will send flonase, azelastine, and mucinex-dm to your pharmacy. Use as instructed.  Continue ibuprofen 6x6 and tylenol 1000 mg po tid.  Call if worsening or failing to improve  Ok to use humidifier - make sure you clean it regularly! And nasal saline.  Thank you  Rich     If you have lab work done today you will be contacted with your lab results within the next 2 weeks.  If you have not heard from [place] then please contact us. The fastest way to get your results is to register for My Chart.   IF you received an x-ray today, you will receive an invoice from [facility]. Please contact [facility] at [phone] with questions or concerns regarding your invoice.   IF you received labwork today, you will receive an invoice from [facility]. Please contact [facility] at [phone] with questions or concerns regarding your invoice.   Our billing staff will not be able to assist you with questions regarding bills from these companies.  You will be contacted with the lab results as soon as they are available. The fastest way to get your results is to activate your My Chart account. Instructions are located on the last page of this paperwork. If you have not heard from [place] regarding the results in 2 weeks, please contact this office.

## 2021-06-11 NOTE — Progress Notes (Signed)
Established Patient Office Visit  Subjective:  Patient ID: Teresa Graves, female    DOB: 02/17/1986  Age: 35 y.o. MRN: [id number]  CC:  Chief Complaint  Patient presents with   Ear Pain    [Patient states she is having some left ear pain since yesterday . Pt states it kept her up lastnight    HPI Teresa Graves presents for L ear pain Onset yesterday Kept her awake last night  Hearing muffled earlier today  Notes recent URI ongoing 8 days  Assumed to be viral until this point. COVID - at home. Taking 1048m tylenol prn and 6041mibuprofen prn, helped somewhat Using delsym, nasal saline, humidifier for URI, some relief.   Past Medical History:  Diagnosis Date   Lumbar herniated disc    SVD (spontaneous vaginal delivery) 11/06/2019    Past Surgical History:  Procedure Laterality Date   ear lobe repaired      Family History  Problem Relation Age of Onset   Hyperlipidemia Mother    Hypertension Mother    Thyroid disease Mother    Kidney cancer Mother    Hyperlipidemia Father    Cancer Father    Cancer Maternal Aunt    Sudden death Neg Hx    Diabetes Neg Hx    Heart attack Neg Hx     Social History   Socioeconomic History   Marital status: Married    Spouse name: Not on file   Number of children: Not on file   Years of education: Not on file   Highest education level: Not on file  Occupational History   Not on file  Tobacco Use   Smoking status: Never   Smokeless tobacco: Never  Vaping Use   Vaping Use: Never used  Substance and Sexual Activity   Alcohol use: Yes    Comment: socially   Drug use: No   Sexual activity: Yes    Birth control/protection: None  Other Topics Concern   Not on file  Social History Narrative   Not on file   Social Determinants of Health   Financial Resource Strain: Not on file  Food Insecurity: Not on file  Transportation Needs: Not on file  Physical Activity: Not on file  Stress: Not on file   Social Connections: Not on file  Intimate Partner Violence: Not on file    Outpatient Medications Prior to Visit  Medication Sig Dispense Refill   acetaminophen (TYLENOL) 500 MG tablet Tylenol  PRN     ibuprofen (ADVIL) 600 MG tablet Take 1 tablet (600 mg total) by mouth every 6 (six) hours. 30 tablet 0   levonorgestrel (MIRENA, 52 MG,) 20 MCG/24HR IUD Mirena 20 mcg/24 hours (7 yrs) 52 mg intrauterine device  Take 1 device by intrauterine route.     amoxicillin (AMOXIL) 875 MG tablet Take 1 tablet (875 mg total) by mouth 2 (two) times daily. (Patient not taking: Reported on 06/11/2021) 20 tablet 0   COVID-19 At Home Antigen Test (CARESTART COVID-19 HOME TEST) KIT use as directed (Patient not taking: Reported on 06/11/2021) 4 each 0   COVID-19 At Home Antigen Test (CARESTART COVID-19 HOME TEST) KIT Use as directed within package instructions (Patient not taking: Reported on 06/11/2021) 4 each 0   neomycin-polymyxin-hydrocortisone (CORTISPORIN) OTIC solution Apply 1-2 drops to toe after soaking BID (Patient not taking: Reported on 06/11/2021) 10 mL 1   neomycin-polymyxin-hydrocortisone (CORTISPORIN) OTIC solution APPLY 1 TO 2 DROPS TO TOE AFTER SOAKING 2 TIMES  DAILY (Patient not taking: Reported on 06/11/2021) 10 mL 1   polyethylene glycol (MIRALAX / GLYCOLAX) 17 g packet Miralax  PRN (Patient not taking: No sig reported)     Prenatal Vit-Fe Fumarate-FA (PRENATAL MULTIVITAMIN) TABS tablet Take 1 tablet by mouth daily at 12 noon. (Patient not taking: Reported on 06/11/2021) 100 tablet 3   No facility-administered medications prior to visit.    Allergies  Allergen Reactions   Doxycycline     Major vomiting     ROS Review of Systems  Constitutional: Negative.   HENT: Negative.    Eyes: Negative.   Respiratory: Negative.    Cardiovascular: Negative.   Gastrointestinal: Negative.   Genitourinary: Negative.   Musculoskeletal: Negative.   Skin: Negative.   Neurological: Negative.    Psychiatric/Behavioral: Negative.    All other systems reviewed and are negative.    Objective:    Physical Exam Vitals and nursing note reviewed.  Constitutional:      General: She is not in acute distress.    Appearance: Normal appearance. She is normal weight. She is not ill-appearing, toxic-appearing or diaphoretic.  HENT:     Right Ear: Tympanic membrane, ear canal and external ear normal. There is no impacted cerumen.     Left Ear: Tympanic membrane, ear canal and external ear normal. There is no impacted cerumen.  Cardiovascular:     Rate and Rhythm: Normal rate and regular rhythm.     Heart sounds: Normal heart sounds. No murmur heard.   No friction rub. No gallop.  Pulmonary:     Effort: Pulmonary effort is normal. No respiratory distress.     Breath sounds: Normal breath sounds. No stridor. No wheezing, rhonchi or rales.  Chest:     Chest wall: No tenderness.  Skin:    General: Skin is warm and dry.  Neurological:     General: No focal deficit present.     Mental Status: She is alert and oriented to person, place, and time. Mental status is at baseline.  Psychiatric:        Mood and Affect: Mood normal.        Behavior: Behavior normal.        Thought Content: Thought content normal.        Judgment: Judgment normal.    BP (!) 113/59    Pulse 65    Temp 98 F (36.7 C) (Temporal)    Resp 18    Ht 5' 8" (1.727 m)    Wt 196 lb (88.9 kg)    SpO2 99%    BMI 29.80 kg/m  Wt Readings from Last 3 Encounters:  06/11/21 196 lb (88.9 kg)  11/05/19 216 lb 6.4 oz (98.2 kg)  06/30/19 193 lb 9.6 oz (87.8 kg)     Health Maintenance Due  Topic Date Due   Hepatitis C Screening  Never done    There are no preventive care reminders to display for this patient.  Lab Results  Component Value Date   TSH 1.67 12/01/2018   Lab Results  Component Value Date   WBC 16.6 (H) 11/06/2019   HGB 12.9 11/06/2019   HCT 37.6 11/06/2019   MCV 93.3 11/06/2019   PLT 147 (L)  11/06/2019   Lab Results  Component Value Date   NA 140 12/01/2018   K 3.8 12/01/2018   CO2 26 12/01/2018   GLUCOSE 80 12/01/2018   BUN 11 12/01/2018   CREATININE 0.69 12/01/2018   BILITOT 1.0 12/01/2018  ALKPHOS 52 12/01/2018   AST 21 12/01/2018   ALT 13 12/01/2018   PROT 7.1 12/01/2018   ALBUMIN 4.7 12/01/2018   CALCIUM 9.7 12/01/2018   GFR 96.60 12/01/2018   Lab Results  Component Value Date   CHOL 192 12/01/2018   Lab Results  Component Value Date   HDL 62.70 12/01/2018   Lab Results  Component Value Date   LDLCALC 112 (H) 12/01/2018   Lab Results  Component Value Date   TRIG 83.0 12/01/2018   Lab Results  Component Value Date   CHOLHDL 3 12/01/2018   No results found for: HGBA1C    Assessment & Plan:   Problem List Items Addressed This Visit   None Visit Diagnoses     Upper respiratory tract infection, unspecified type    -  Primary   Relevant Medications   amoxicillin-clavulanate (AUGMENTIN) 875-125 MG tablet   dextromethorphan-guaiFENesin (MUCINEX DM) 30-600 MG 12hr tablet   azelastine (ASTELIN) 0.1 % nasal spray   fluticasone (FLONASE) 50 MCG/ACT nasal spray       Meds ordered this encounter  Medications   DISCONTD: amoxicillin-clavulanate (AUGMENTIN) 875-125 MG tablet    Sig: Take 1 tablet by mouth 2 (two) times daily.    Dispense:  20 tablet    Refill:  0    Order Specific Question:   Supervising Provider    Answer:   Carlota Raspberry, JEFFREY R [[id number]]   DISCONTD: azelastine (ASTELIN) 0.1 % nasal spray    Sig: Place 1 spray into both nostrils 2 (two) times daily. Use in each nostril as directed    Dispense:  30 mL    Refill:  12    Order Specific Question:   Supervising Provider    Answer:   Carlota Raspberry, JEFFREY R [[id number]]   DISCONTD: fluticasone (FLONASE) 50 MCG/ACT nasal spray    Sig: Place 2 sprays into both nostrils daily.    Dispense:  16 g    Refill:  6    Order Specific Question:   Supervising Provider    Answer:   Carlota Raspberry, JEFFREY R [[id number]]    DISCONTD: dextromethorphan-guaiFENesin (MUCINEX DM) 30-600 MG 12hr tablet    Sig: Take 1 tablet by mouth 2 (two) times daily.    Dispense:  20 tablet    Refill:  0    Order Specific Question:   Supervising Provider    Answer:   Carlota Raspberry, JEFFREY R [[id number]]   amoxicillin-clavulanate (AUGMENTIN) 875-125 MG tablet    Sig: Take 1 tablet by mouth 2 (two) times daily.    Dispense:  20 tablet    Refill:  0    Order Specific Question:   Supervising Provider    Answer:   Carlota Raspberry, JEFFREY R [[id number]]   dextromethorphan-guaiFENesin (MUCINEX DM) 30-600 MG 12hr tablet    Sig: Take 1 tablet by mouth 2 (two) times daily.    Dispense:  20 tablet    Refill:  0    Order Specific Question:   Supervising Provider    Answer:   Carlota Raspberry, JEFFREY R [[id number]]   azelastine (ASTELIN) 0.1 % nasal spray    Sig: Place 1 spray into both nostrils 2 (two) times daily. Use in each nostril as directed    Dispense:  30 mL    Refill:  12    Order Specific Question:   Supervising Provider    Answer:   Carlota Raspberry, JEFFREY R [[id number]]   fluticasone (FLONASE) 50 MCG/ACT nasal spray    Sig: Place 2 sprays into  both nostrils daily.    Dispense:  16 g    Refill:  6    Order Specific Question:   Supervising Provider    Answer:   Carlota Raspberry, JEFFREY R [[id number]]    Follow-up: Return if symptoms worsen or fail to improve.   PLAN Augmentin po bid x 10 days Azelastine, flonase, and mucinex-dm for symptom relief Recommend around the clock tylenol and ibuprofen for pain relief. Return if worsening or failing to improve Continue with home supportive care of nasal saline and humidifier Patient encouraged to call clinic with any questions, comments, or concerns.  Maximiano Coss, NP

## 2021-07-24 ENCOUNTER — Encounter: Payer: No Typology Code available for payment source | Admitting: Family Medicine

## 2021-07-27 ENCOUNTER — Encounter: Payer: Self-pay | Admitting: Family Medicine

## 2021-07-27 ENCOUNTER — Ambulatory Visit (INDEPENDENT_AMBULATORY_CARE_PROVIDER_SITE_OTHER): Payer: No Typology Code available for payment source | Admitting: Family Medicine

## 2021-07-27 VITALS — BP 122/78 | HR 52 | Temp 97.9°F | Resp 16 | Ht 68.0 in | Wt 189.6 lb

## 2021-07-27 DIAGNOSIS — E785 Hyperlipidemia, unspecified: Secondary | ICD-10-CM

## 2021-07-27 DIAGNOSIS — Z0000 Encounter for general adult medical examination without abnormal findings: Secondary | ICD-10-CM | POA: Diagnosis not present

## 2021-07-27 DIAGNOSIS — E559 Vitamin D deficiency, unspecified: Secondary | ICD-10-CM | POA: Diagnosis not present

## 2021-07-27 LAB — CBC WITH DIFFERENTIAL/PLATELET
Basophils Absolute: 0 10*3/uL (ref 0.0–0.1)
Basophils Relative: 0.6 % (ref 0.0–3.0)
Eosinophils Absolute: 0.1 10*3/uL (ref 0.0–0.7)
Eosinophils Relative: 2.3 % (ref 0.0–5.0)
HCT: 42 % (ref 36.0–46.0)
Hemoglobin: 13.8 g/dL (ref 12.0–15.0)
Lymphocytes Relative: 31 % (ref 12.0–46.0)
Lymphs Abs: 1.7 10*3/uL (ref 0.7–4.0)
MCHC: 32.9 g/dL (ref 30.0–36.0)
MCV: 90.2 fl (ref 78.0–100.0)
Monocytes Absolute: 0.4 10*3/uL (ref 0.1–1.0)
Monocytes Relative: 7.3 % (ref 3.0–12.0)
Neutro Abs: 3.2 10*3/uL (ref 1.4–7.7)
Neutrophils Relative %: 58.8 % (ref 43.0–77.0)
Platelets: 227 10*3/uL (ref 150.0–400.0)
RBC: 4.65 Mil/uL (ref 3.87–5.11)
RDW: 13.1 % (ref 11.5–15.5)
WBC: 5.4 10*3/uL (ref 4.0–10.5)

## 2021-07-27 LAB — TSH: TSH: 1.57 u[IU]/mL (ref 0.35–5.50)

## 2021-07-27 LAB — BASIC METABOLIC PANEL
BUN: 17 mg/dL (ref 6–23)
CO2: 30 mEq/L (ref 19–32)
Calcium: 10 mg/dL (ref 8.4–10.5)
Chloride: 103 mEq/L (ref 96–112)
Creatinine, Ser: 0.84 mg/dL (ref 0.40–1.20)
GFR: 88.38 mL/min (ref >60.00)
Glucose, Bld: 80 mg/dL (ref 70–99)
Potassium: 4.2 mEq/L (ref 3.5–5.1)
Sodium: 138 mEq/L (ref 135–145)

## 2021-07-27 LAB — HEPATIC FUNCTION PANEL
ALT: 12 U/L (ref 0–35)
AST: 17 U/L (ref 0–37)
Albumin: 4.7 g/dL (ref 3.5–5.2)
Alkaline Phosphatase: 50 U/L (ref 39–117)
Bilirubin, Direct: 0.1 mg/dL (ref 0.0–0.3)
Total Bilirubin: 0.7 mg/dL (ref 0.2–1.2)
Total Protein: 7.3 g/dL (ref 6.0–8.3)

## 2021-07-27 LAB — LIPID PANEL
Cholesterol: 171 mg/dL (ref 0–200)
HDL: 40.4 mg/dL (ref >39.00)
LDL Cholesterol: 106 mg/dL — ABNORMAL HIGH (ref 0–99)
NonHDL: 130.71
Total CHOL/HDL Ratio: 4
Triglycerides: 122 mg/dL (ref 0.0–149.0)
VLDL: 24.4 mg/dL (ref 0.0–40.0)

## 2021-07-27 LAB — VITAMIN D 25 HYDROXY (VIT D DEFICIENCY, FRACTURES): VITD: 21.17 ng/mL — ABNORMAL LOW (ref 30.00–100.00)

## 2021-07-27 NOTE — Assessment & Plan Note (Signed)
Check Vit D and replete prn. 

## 2021-07-27 NOTE — Progress Notes (Signed)
° °  Subjective:    Patient ID: Teresa Graves, female    DOB: [birth year], 36 y.o.   MRN: [id number]  HPI CPE- UTD on pap, Tdap, flu.  No concerns today.  Patient Care Team    Relationship Specialty Notifications Start End  Midge Minium, MD PCP - General Family Medicine  06/28/11    Comment: Jennell Corner, MD Consulting Physician Obstetrics and Gynecology  11/27/17     Health Maintenance  Topic Date Due   Hepatitis C Screening  Never done   PAP SMEAR-Modifier  05/03/2022   TETANUS/TDAP  08/10/2029   INFLUENZA VACCINE  Completed   HIV Screening  Completed   HPV VACCINES  Aged Out      Review of Systems Patient reports no vision/ hearing changes, adenopathy,fever, persistant/recurrent hoarseness , swallowing issues, chest pain, palpitations, edema, persistant/recurrent cough, hemoptysis, dyspnea (rest/exertional/paroxysmal nocturnal), gastrointestinal bleeding (melena, rectal bleeding), abdominal pain, significant heartburn, bowel changes, GU symptoms (dysuria, hematuria, incontinence), Gyn symptoms (abnormal  bleeding, pain),  syncope, focal weakness, memory loss, numbness & tingling, skin/hair/nail changes, abnormal bruising or bleeding, anxiety, or depression.   + 7 lb weight loss- doing [employer]  This visit occurred during the SARS-CoV-2 public health emergency.  Safety protocols were in place, including screening questions prior to the visit, additional usage of staff PPE, and extensive cleaning of exam room while observing appropriate contact time as indicated for disinfecting solutions.      Objective:   Physical Exam General Appearance:    Alert, cooperative, no distress, appears stated age  Head:    Normocephalic, without obvious abnormality, atraumatic  Eyes:    PERRL, conjunctiva/corneas clear, EOM's intact, fundi    benign, both eyes  Ears:    Normal TM's and external ear canals, both ears  Nose:   Deferred due to COVID  Throat:   Neck:   Supple,  symmetrical, trachea midline, no adenopathy;    Thyroid: no enlargement/tenderness/nodules  Back:     Symmetric, no curvature, ROM normal, no CVA tenderness  Lungs:     Clear to auscultation bilaterally, respirations unlabored  Chest Wall:    No tenderness or deformity   Heart:    Regular rate and rhythm, S1 and S2 normal, no murmur, rub   or gallop  Breast Exam:    Deferred to GYN  Abdomen:     Soft, non-tender, bowel sounds active all four quadrants,    no masses, no organomegaly  Genitalia:    Deferred to GYN  Rectal:    Extremities:   Extremities normal, atraumatic, no cyanosis or edema  Pulses:   2+ and symmetric all extremities  Skin:   Skin color, texture, turgor normal, no rashes or lesions  Lymph nodes:   Cervical, supraclavicular, and axillary nodes normal  Neurologic:   CNII-XII intact, normal strength, sensation and reflexes    throughout          Assessment & Plan:

## 2021-07-27 NOTE — Patient Instructions (Signed)
Follow up in 1 year or as needed We'll notify you of your lab results and make any changes if needed Keep up the good work on healthy diet and regular exercise- you look great!! Call with any questions or concerns Stay Safe!  Stay Healthy! Have a great weekend! 

## 2021-07-27 NOTE — Assessment & Plan Note (Signed)
Pt's PE WNL.  UTD on Tdap, flu.  UTD on pap.  Check labs.  Anticipatory guidance provided.

## 2021-07-31 ENCOUNTER — Other Ambulatory Visit: Payer: Self-pay

## 2021-07-31 ENCOUNTER — Other Ambulatory Visit (HOSPITAL_COMMUNITY): Payer: Self-pay

## 2021-07-31 MED ORDER — VITAMIN D (ERGOCALCIFEROL) 1.25 MG (50000 UNIT) PO CAPS
50000.0000 [IU] | ORAL_CAPSULE | ORAL | 0 refills | Status: AC
Start: 2021-07-31 — End: ?
  Filled 2021-07-31: qty 12, 84d supply, fill #0

## 2022-01-11 ENCOUNTER — Encounter: Payer: Self-pay | Admitting: Family Medicine

## 2022-12-05 ENCOUNTER — Encounter: Payer: Self-pay | Admitting: Family Medicine

## 2022-12-05 ENCOUNTER — Ambulatory Visit (INDEPENDENT_AMBULATORY_CARE_PROVIDER_SITE_OTHER): Payer: 59 | Admitting: Family Medicine

## 2022-12-05 VITALS — BP 110/62 | HR 61 | Temp 98.6°F | Ht 68.0 in | Wt 185.4 lb

## 2022-12-05 DIAGNOSIS — J020 Streptococcal pharyngitis: Secondary | ICD-10-CM | POA: Diagnosis not present

## 2022-12-05 DIAGNOSIS — J029 Acute pharyngitis, unspecified: Secondary | ICD-10-CM | POA: Diagnosis not present

## 2022-12-05 LAB — POCT RAPID STREP A (OFFICE): Rapid Strep A Screen: POSITIVE — AB

## 2022-12-05 MED ORDER — AMOXICILLIN 500 MG PO CAPS: 500.0000 mg | ORAL_CAPSULE | Freq: Two times a day (BID) | ORAL | 0 refills | Status: AC

## 2022-12-05 NOTE — Patient Instructions (Signed)
Start amoxicillin for strep pharyngitis.  10-day course.  Cepacol lozenges, ibuprofen as needed for sore throat.  Drink plenty fluids, rest.  You are considered less contagious after 24 hours on antibiotics.  Watch for signs of abscess but I do not think that will occur.  Thanks for coming in and hope you feel better soon!  Strep Throat, Adult Strep throat is an infection in the throat that is caused by bacteria. It is common during the cold months of the year. It mostly affects children who are 39-39 years old. However, people of all ages can get it at any time of the year. This infection spreads from person to person (is contagious) through coughing, sneezing, or having close contact. Your health care provider may use other names to describe the infection. When strep throat affects the tonsils, it is called tonsillitis. When it affects the back of the throat, it is called pharyngitis. What are the causes? This condition is caused by the Streptococcus pyogenes bacteria. What increases the risk? You are more likely to develop this condition if: You care for school-age children, or are around school-age children. Children are more likely to get strep throat and may spread it to others. You spend time in crowded places where the infection can spread easily. You have close contact with someone who has strep throat. What are the signs or symptoms? Symptoms of this condition include: Fever or chills. Redness, swelling, or pain in the tonsils or throat. Pain or difficulty when swallowing. White or yellow spots on the tonsils or throat. Tender glands in the neck and under the jaw. Bad smelling breath. Red rash all over the body. This is rare. How is this diagnosed? This condition is diagnosed by tests that check for the presence and the amount of bacteria that cause strep throat. They are: Rapid strep test. Your throat is swabbed and checked for the presence of bacteria. Results are usually ready in  minutes. Throat culture test. Your throat is swabbed. The sample is placed in a cup that allows infections to grow. Results are usually ready in 1 or 2 days. How is this treated? This condition may be treated with: Medicines that kill germs (antibiotics). Medicines that relieve pain or fever. These include: Ibuprofen or acetaminophen. Aspirin, only for people who are over the age of 39. Throat lozenges. Throat sprays. Follow these instructions at home: Medicines  Take over-the-counter and prescription medicines only as told by your health care provider. Take your antibiotic medicine as told by your health care provider. Do not stop taking the antibiotic even if you start to feel better. Eating and drinking  If you have trouble swallowing, try eating soft foods until your sore throat feels better. Drink enough fluid to keep your urine pale yellow. To help relieve pain, you may have: Warm fluids, such as soup and tea. Cold fluids, such as frozen desserts or popsicles. General instructions Gargle with a salt-water mixture 3-4 times a day or as needed. To make a salt-water mixture, completely dissolve -1 tsp (3-6 g) of salt in 1 cup (237 mL) of warm water. Get plenty of rest. Stay home from work or school until you have been taking antibiotics for 24 hours. Do not use any products that contain nicotine or tobacco. These products include cigarettes, chewing tobacco, and vaping devices, such as e-cigarettes. If you need help quitting, ask your health care provider. It is up to you to get your test results. Ask your health care provider, or  the department that is doing the test, when your results will be ready. Keep all follow-up visits. This is important. How is this prevented?  Do not share food, drinking cups, or personal items that could cause the infection to spread to other people. Wash your hands often with soap and water for at least 20 seconds. If soap and water are not available,  use hand sanitizer. Make sure that all people in your house wash their hands well. Have family members tested if they have a sore throat or fever. They may need an antibiotic if they have strep throat. Contact a health care provider if: You have swelling in your neck that keeps getting bigger. You develop a rash, cough, or earache. You cough up a thick mucus that is green, yellow-brown, or bloody. You have pain or discomfort that does not get better with medicine. Your symptoms seem to be getting worse. You have a fever. Get help right away if: You have new symptoms, such as vomiting, severe headache, stiff or painful neck, chest pain, or shortness of breath. You have severe throat pain, drooling, or changes in your voice. You have swelling of the neck, or the skin on the neck becomes red and tender. You have signs of dehydration, such as tiredness (fatigue), dry mouth, and decreased urination. You become increasingly sleepy, or you cannot wake up completely. Your joints become red or painful. These symptoms may represent a serious problem that is an emergency. Do not wait to see if the symptoms will go away. Get medical help right away. Call your local emergency services (911 in the [place] Do not drive yourself to the hospital. Summary Strep throat is an infection in the throat that is caused by the Streptococcus pyogenes bacteria. This infection is spread from person to person (is contagious) through coughing, sneezing, or having close contact. Take your medicines, including antibiotics, as told by your health care provider. Do not stop taking the antibiotic even if you start to feel better. To prevent the spread of germs, wash your hands well with soap and water. Have others do the same. Do not share food, drinking cups, or personal items. Get help right away if you have new symptoms, such as vomiting, severe headache, stiff or painful neck, chest pain, or shortness of breath. This  information is not intended to replace advice given to you by your health care provider. Make sure you discuss any questions you have with your health care provider. Document Revised: 10/03/2020 Document Reviewed: 10/03/2020 Elsevier Patient Education  2024 [employer].

## 2022-12-05 NOTE — Progress Notes (Signed)
Subjective:  Patient ID: Teresa Graves, female    DOB: [birth year]  Age: 37 y.o. MRN: [id number]  CC:  Chief Complaint  Patient presents with   Sore Throat    Pt notes possible strep exposure via children, notes sore throat starting Monday, notes worse than she has experienced in the past, some sinus and ear pressure     HPI Teresa Graves presents for   Sore throat Started 3 days ago - worse past few days. Able to swallow with use of ibuprofen 600mg .  Minimal sinus pressure, ear pressure.  No cough.  No fever.  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    History Patient Active Problem List   Diagnosis Date Noted   SVD (spontaneous vaginal delivery) 11/06/2019   Normal pregnancy in multigravida in third trimester 11/05/2019   Vitamin D deficiency 12/01/2018   Hyperlipidemia 12/01/2018   Lumbar back pain 12/19/2014   Facet syndrome, lumbar 06/29/2014   Allergic rhinitis 03/31/2014   Back pain, thoracic 08/09/2012   Physical exam 12/18/2011   Acne 06/27/2011   Past Medical History:  Diagnosis Date   Lumbar herniated disc    SVD (spontaneous vaginal delivery) 11/06/2019   Past Surgical History:  Procedure Laterality Date   ear lobe repaired     Allergies  Allergen Reactions   Doxycycline     Major vomiting    Prior to Admission medications   Medication Sig Start Date End Date Taking? Authorizing Provider  acetaminophen (TYLENOL) 500 MG tablet Tylenol  PRN   Yes [provider]  levonorgestrel (MIRENA, 52 MG,) 20 MCG/24HR IUD Mirena 20 mcg/24 hours (7 yrs) 52 mg intrauterine device  Take 1 device by intrauterine route.   Yes [provider]  Vitamin D, Ergocalciferol, (DRISDOL) 1.25 MG (50000 UNIT) CAPS capsule Take 1 capsule by mouth every 7 days. 07/31/21  Yes Sheliah Hatch, MD   Social History   Socioeconomic History    Marital status: Married    Spouse name: Not on file   Number of children: Not on file   Years of education: Not on file   Highest education level: Not on file  Occupational History   Not on file  Tobacco Use   Smoking status: Never   Smokeless tobacco: Never  Vaping Use   Vaping Use: Never used  Substance and Sexual Activity   Alcohol use: Yes    Comment: socially   Drug use: No   Sexual activity: Yes    Birth control/protection: None  Other Topics Concern   Not on file  Social History Narrative   Not on file   Social Determinants of Health   Financial Resource Strain: Not on file  Food Insecurity: Not on file  Transportation Needs: Not on file  Physical Activity: Not on file  Stress: Not on file  Social Connections: Not on file  Intimate Partner Violence: Not on file    Review of Systems Per hpi.   Objective:   Vitals:   12/05/22 1533  BP: 110/62  Pulse: 61  Temp: 98.6 F (37 C)  TempSrc: Temporal  SpO2: 94%  Weight: 185 lb 6.4 oz (84.1 kg)  Height: 5\' 8"  (1.727 m)     Physical Exam Vitals reviewed.  Constitutional:      General: She is not in acute distress.    Appearance: She is well-developed.  HENT:  Head: Normocephalic and atraumatic.     Right Ear: Hearing, tympanic membrane, ear canal and external ear normal.     Left Ear: Hearing, tympanic membrane, ear canal and external ear normal.     Nose: Nose normal.     Mouth/Throat:     Pharynx: Oropharyngeal exudate (Cryptic tonsils, with small amount of exudate in the left tonsillar recess, no significant asymmetry or signs of abscess, uvula midline.  Clearing secretions and speaking without difficulty.) and posterior oropharyngeal erythema present. No uvula swelling.  Eyes:     Conjunctiva/sclera: Conjunctivae normal.     Pupils: Pupils are equal, round, and reactive to light.  Cardiovascular:     Rate and Rhythm: Normal rate and regular rhythm.     Heart sounds: Normal heart sounds. No  murmur heard. Pulmonary:     Effort: Pulmonary effort is normal. No respiratory distress.     Breath sounds: Normal breath sounds. No stridor. No wheezing or rhonchi.  Skin:    General: Skin is warm and dry.     Findings: No rash.  Neurological:     Mental Status: She is alert and oriented to person, place, and time.  Psychiatric:        Mood and Affect: Mood normal.        Behavior: Behavior normal.       Assessment & Plan:  Teresa Graves is a 37 y.o. female . Sore throat - Plan: POCT rapid strep A, amoxicillin (AMOXIL) 500 MG capsule  Strep pharyngitis - Plan: POCT rapid strep A, amoxicillin (AMOXIL) 500 MG capsule Strep pharyngitis without sign of abscess.  Clearing secretions.  Start amoxicillin, 10-day course.  Symptomatic care discussed, RTC/ER precautions given for peritonsillar abscess, unlikely to occur.  Handout given.  Contact precautions, contagiousness interval discussed.  Meds ordered this encounter  Medications   amoxicillin (AMOXIL) 500 MG capsule    Sig: Take 1 capsule (500 mg total) by mouth 2 (two) times daily for 10 days.    Dispense:  20 capsule    Refill:  0   Patient Instructions  Start amoxicillin for strep pharyngitis.  10-day course.  Cepacol lozenges, ibuprofen as needed for sore throat.  Drink plenty fluids, rest.  You are considered less contagious after 24 hours on antibiotics.  Watch for signs of abscess but I do not think that will occur.  Thanks for coming in and hope you feel better soon!  Strep Throat, Adult Strep throat is an infection in the throat that is caused by bacteria. It is common during the cold months of the year. It mostly affects children who are 65-64 years old. However, people of all ages can get it at any time of the year. This infection spreads from person to person (is contagious) through coughing, sneezing, or having close contact. Your health care provider may use other names to describe the infection. When strep  throat affects the tonsils, it is called tonsillitis. When it affects the back of the throat, it is called pharyngitis. What are the causes? This condition is caused by the Streptococcus pyogenes bacteria. What increases the risk? You are more likely to develop this condition if: You care for school-age children, or are around school-age children. Children are more likely to get strep throat and may spread it to others. You spend time in crowded places where the infection can spread easily. You have close contact with someone who has strep throat. What are the signs or symptoms? Symptoms of this condition  include: Fever or chills. Redness, swelling, or pain in the tonsils or throat. Pain or difficulty when swallowing. White or yellow spots on the tonsils or throat. Tender glands in the neck and under the jaw. Bad smelling breath. Red rash all over the body. This is rare. How is this diagnosed? This condition is diagnosed by tests that check for the presence and the amount of bacteria that cause strep throat. They are: Rapid strep test. Your throat is swabbed and checked for the presence of bacteria. Results are usually ready in minutes. Throat culture test. Your throat is swabbed. The sample is placed in a cup that allows infections to grow. Results are usually ready in 1 or 2 days. How is this treated? This condition may be treated with: Medicines that kill germs (antibiotics). Medicines that relieve pain or fever. These include: Ibuprofen or acetaminophen. Aspirin, only for people who are over the age of 48. Throat lozenges. Throat sprays. Follow these instructions at home: Medicines  Take over-the-counter and prescription medicines only as told by your health care provider. Take your antibiotic medicine as told by your health care provider. Do not stop taking the antibiotic even if you start to feel better. Eating and drinking  If you have trouble swallowing, try eating soft  foods until your sore throat feels better. Drink enough fluid to keep your urine pale yellow. To help relieve pain, you may have: Warm fluids, such as soup and tea. Cold fluids, such as frozen desserts or popsicles. General instructions Gargle with a salt-water mixture 3-4 times a day or as needed. To make a salt-water mixture, completely dissolve -1 tsp (3-6 g) of salt in 1 cup (237 mL) of warm water. Get plenty of rest. Stay home from work or school until you have been taking antibiotics for 24 hours. Do not use any products that contain nicotine or tobacco. These products include cigarettes, chewing tobacco, and vaping devices, such as e-cigarettes. If you need help quitting, ask your health care provider. It is up to you to get your test results. Ask your health care provider, or the department that is doing the test, when your results will be ready. Keep all follow-up visits. This is important. How is this prevented?  Do not share food, drinking cups, or personal items that could cause the infection to spread to other people. Wash your hands often with soap and water for at least 20 seconds. If soap and water are not available, use hand sanitizer. Make sure that all people in your house wash their hands well. Have family members tested if they have a sore throat or fever. They may need an antibiotic if they have strep throat. Contact a health care provider if: You have swelling in your neck that keeps getting bigger. You develop a rash, cough, or earache. You cough up a thick mucus that is green, yellow-brown, or bloody. You have pain or discomfort that does not get better with medicine. Your symptoms seem to be getting worse. You have a fever. Get help right away if: You have new symptoms, such as vomiting, severe headache, stiff or painful neck, chest pain, or shortness of breath. You have severe throat pain, drooling, or changes in your voice. You have swelling of the neck, or the  skin on the neck becomes red and tender. You have signs of dehydration, such as tiredness (fatigue), dry mouth, and decreased urination. You become increasingly sleepy, or you cannot wake up completely. Your joints become red or  painful. These symptoms may represent a serious problem that is an emergency. Do not wait to see if the symptoms will go away. Get medical help right away. Call your local emergency services (911 in the [place] Do not drive yourself to the hospital. Summary Strep throat is an infection in the throat that is caused by the Streptococcus pyogenes bacteria. This infection is spread from person to person (is contagious) through coughing, sneezing, or having close contact. Take your medicines, including antibiotics, as told by your health care provider. Do not stop taking the antibiotic even if you start to feel better. To prevent the spread of germs, wash your hands well with soap and water. Have others do the same. Do not share food, drinking cups, or personal items. Get help right away if you have new symptoms, such as vomiting, severe headache, stiff or painful neck, chest pain, or shortness of breath. This information is not intended to replace advice given to you by your health care provider. Make sure you discuss any questions you have with your health care provider. Document Revised: 10/03/2020 Document Reviewed: 10/03/2020 [employer] Patient Education  2024 [employer]     Signed,   Meredith Staggers, MD [facility], [facility] 12/05/22 4:09 PM

## 2023-01-22 DIAGNOSIS — Z30431 Encounter for routine checking of intrauterine contraceptive device: Secondary | ICD-10-CM | POA: Diagnosis not present

## 2023-01-22 DIAGNOSIS — Z1389 Encounter for screening for other disorder: Secondary | ICD-10-CM | POA: Diagnosis not present

## 2023-01-22 DIAGNOSIS — Z01419 Encounter for gynecological examination (general) (routine) without abnormal findings: Secondary | ICD-10-CM | POA: Diagnosis not present

## 2024-02-12 DIAGNOSIS — Z01419 Encounter for gynecological examination (general) (routine) without abnormal findings: Secondary | ICD-10-CM | POA: Diagnosis not present

## 2024-02-12 DIAGNOSIS — Z1231 Encounter for screening mammogram for malignant neoplasm of breast: Secondary | ICD-10-CM | POA: Diagnosis not present

## 2024-02-12 DIAGNOSIS — Z1151 Encounter for screening for human papillomavirus (HPV): Secondary | ICD-10-CM | POA: Diagnosis not present

## 2024-02-12 DIAGNOSIS — Z1389 Encounter for screening for other disorder: Secondary | ICD-10-CM | POA: Diagnosis not present

## 2024-02-12 DIAGNOSIS — Z30431 Encounter for routine checking of intrauterine contraceptive device: Secondary | ICD-10-CM | POA: Diagnosis not present

## 2024-02-12 DIAGNOSIS — Z124 Encounter for screening for malignant neoplasm of cervix: Secondary | ICD-10-CM | POA: Diagnosis not present
